# Patient Record
Sex: Female | Born: 1990
Health system: Southern US, Community
[De-identification: ages and names within clinical notes are randomized; demographics above are authoritative.]

## PROBLEM LIST (undated history)

## (undated) ENCOUNTER — Inpatient Hospital Stay (HOSPITAL_COMMUNITY): Payer: Self-pay

## (undated) DIAGNOSIS — R51 Headache: Secondary | ICD-10-CM

## (undated) DIAGNOSIS — N883 Incompetence of cervix uteri: Secondary | ICD-10-CM

## (undated) HISTORY — PX: CERCLAGE REMOVAL: SUR1451

## (undated) HISTORY — PX: DILATION AND CURETTAGE OF UTERUS: SHX78

---

## 2010-05-18 ENCOUNTER — Emergency Department (HOSPITAL_COMMUNITY)
Admission: EM | Admit: 2010-05-18 | Discharge: 2010-05-19 | Payer: Self-pay | Source: Home / Self Care | Admitting: Emergency Medicine

## 2010-08-26 LAB — URINE CULTURE: Culture  Setup Time: 201112051020

## 2010-08-26 LAB — URINALYSIS, ROUTINE W REFLEX MICROSCOPIC
Bilirubin Urine: NEGATIVE
Glucose, UA: NEGATIVE mg/dL
Ketones, ur: NEGATIVE mg/dL
Protein, ur: NEGATIVE mg/dL
pH: 6 (ref 5.0–8.0)

## 2010-08-26 LAB — URINE MICROSCOPIC-ADD ON

## 2011-06-02 ENCOUNTER — Encounter: Payer: Self-pay | Admitting: *Deleted

## 2011-06-02 ENCOUNTER — Emergency Department (HOSPITAL_COMMUNITY)
Admission: EM | Admit: 2011-06-02 | Discharge: 2011-06-02 | Discharge status: Against Medical Advice | Payer: 59 | Attending: Emergency Medicine | Admitting: Emergency Medicine

## 2011-06-02 DIAGNOSIS — Z0389 Encounter for observation for other suspected diseases and conditions ruled out: Secondary | ICD-10-CM | POA: Insufficient documentation

## 2011-06-02 NOTE — ED Notes (Signed)
Pt left AMA post triage, pre MD screening

## 2011-06-02 NOTE — ED Notes (Signed)
Patient reports she had a miscarriage in august.  She is pregnant again at approx 8 weeks.  She states 2 days ago she woke and noted some blood in her panties.  She states she has since seen dark colored discharge.  She also reports clots in the discharge.  Patient states she has gas bubbles denies abd pain

## 2011-06-02 NOTE — ED Notes (Signed)
Pt left AMA, let nurse first know.

## 2011-06-03 ENCOUNTER — Inpatient Hospital Stay (HOSPITAL_COMMUNITY): Payer: 59

## 2011-06-03 ENCOUNTER — Other Ambulatory Visit: Payer: Self-pay

## 2011-06-03 ENCOUNTER — Inpatient Hospital Stay (HOSPITAL_COMMUNITY)
Admission: AD | Admit: 2011-06-03 | Discharge: 2011-06-03 | Disposition: A | Discharge status: Home / Self Care | Payer: 59 | Source: Ambulatory Visit | Attending: Obstetrics and Gynecology | Admitting: Obstetrics and Gynecology

## 2011-06-03 ENCOUNTER — Encounter (HOSPITAL_COMMUNITY): Payer: Self-pay | Admitting: *Deleted

## 2011-06-03 DIAGNOSIS — O209 Hemorrhage in early pregnancy, unspecified: Secondary | ICD-10-CM | POA: Insufficient documentation

## 2011-06-03 DIAGNOSIS — O30049 Twin pregnancy, dichorionic/diamniotic, unspecified trimester: Secondary | ICD-10-CM

## 2011-06-03 DIAGNOSIS — O26859 Spotting complicating pregnancy, unspecified trimester: Secondary | ICD-10-CM

## 2011-06-03 DIAGNOSIS — O30009 Twin pregnancy, unspecified number of placenta and unspecified number of amniotic sacs, unspecified trimester: Secondary | ICD-10-CM | POA: Insufficient documentation

## 2011-06-03 DIAGNOSIS — O200 Threatened abortion: Secondary | ICD-10-CM

## 2011-06-03 LAB — CBC
Hemoglobin: 11.5 g/dL — ABNORMAL LOW (ref 12.0–15.0)
MCH: 30.8 pg (ref 26.0–34.0)
MCHC: 34.1 g/dL (ref 30.0–36.0)
MCV: 90.3 fL (ref 78.0–100.0)
RBC: 3.73 MIL/uL — ABNORMAL LOW (ref 3.87–5.11)

## 2011-06-03 LAB — URINE MICROSCOPIC-ADD ON

## 2011-06-03 LAB — URINALYSIS, ROUTINE W REFLEX MICROSCOPIC
Bilirubin Urine: NEGATIVE
Nitrite: NEGATIVE
Specific Gravity, Urine: >1.03 — ABNORMAL HIGH (ref 1.005–1.030)
Urobilinogen, UA: 0.2 mg/dL (ref 0.0–1.0)
pH: 5.5 (ref 5.0–8.0)

## 2011-06-03 LAB — WET PREP, GENITAL
Clue Cells Wet Prep HPF POC: NONE SEEN
Yeast Wet Prep HPF POC: NONE SEEN

## 2011-06-03 NOTE — Progress Notes (Signed)
T. Burleson, NP at bedside.  Assessment done and poc discussed with pt.  

## 2011-06-03 NOTE — ED Provider Notes (Signed)
History     Chief Complaint  Patient presents with  . Vaginal Discharge   HPI Gabriela Daugherty 20 y.o. LMP 04-15-11.  Positive home pregnancy test.  Student here in [place].  From [place] and will be traveling there for 2 weeks over the holidays.  Plans to see OB/GYN in [place] for one visit before returning to [facility].  Prior hx of 2 SABs.  Client very concerned about cervical length.  Apparently has been a problem in the past.  Hx of spontaneous loss of twin gestation at 45 weeks.  OB History    Grav Para Term Preterm Abortions TAB SAB Ect Mult Living   4 0 0 0 3 2 1 0 0 0       Past Medical History  Diagnosis Date  . No pertinent past medical history     Past Surgical History  Procedure Date  . Dilation and curettage of uterus     Family History  Problem Relation Age of Onset  . Hypertension Mother   . Depression Mother   . Hypertension Father   . Diabetes Father   . Stroke Father     History  Substance Use Topics  . Smoking status: Never Smoker   . Smokeless tobacco: Not on file  . Alcohol Use: No    Allergies: No Known Allergies  Prescriptions prior to admission  Medication Sig Dispense Refill  . Prenatal Vit-Fe Fumarate-FA (MULTIVITAMIN-PRENATAL) 27-0.8 MG TABS Take 1 tablet by mouth daily.          Review of Systems  Genitourinary:       Vaginal bleeding   Physical Exam   Blood pressure 131/89, pulse 96, temperature 98.2 F (36.8 C), temperature source Oral, resp. rate 20, height 5\' 6"  (1.676 m), weight 244 lb (110.678 kg), last menstrual period 04/15/2011, SpO2 98.00%.  Physical Exam  Nursing note and vitals reviewed. Constitutional: She is oriented to person, place, and time. She appears well-developed and well-nourished.  HENT:  Head: Normocephalic.  Eyes: EOM are normal.  Neck: Neck supple.  GI: Soft. There is no tenderness.  Genitourinary:       Speculum exam: Vagina - Small amount of dark blood with mucus noted, no odor Cervix - No contact  bleeding, no active bleeding seen Bimanual exam: Cervix external os open 1 cm, thick, firm Uterus non tender, difficult to size due to habitus Adnexa non tender, no masses bilaterally GC/Chlam, wet prep done Chaperone present for exam.  Musculoskeletal: Normal range of motion.  Neurological: She is alert and oriented to person, place, and time.  Skin: Skin is warm and dry.  Psychiatric: She has a normal mood and affect.   Results for orders placed during the hospital encounter of 06/03/11 (from the past 24 hour(s))  URINALYSIS, ROUTINE W REFLEX MICROSCOPIC     Status: Abnormal   Collection Time   06/03/11  6:47 PM      Component Value Range   Color, Urine YELLOW  YELLOW    APPearance CLEAR  CLEAR    Specific Gravity, Urine >1.030 (*) 1.005 - 1.030    pH 5.5  5.0 - 8.0    Glucose, UA NEGATIVE  NEGATIVE (mg/dL)   Hgb urine dipstick SMALL (*) NEGATIVE    Bilirubin Urine NEGATIVE  NEGATIVE    Ketones, ur NEGATIVE  NEGATIVE (mg/dL)   Protein, ur NEGATIVE  NEGATIVE (mg/dL)   Urobilinogen, UA 0.2  0.0 - 1.0 (mg/dL)   Nitrite NEGATIVE  NEGATIVE    Leukocytes,  UA NEGATIVE  NEGATIVE   URINE MICROSCOPIC-ADD ON     Status: Abnormal   Collection Time   06/03/11  6:47 PM      Component Value Range   Squamous Epithelial / LPF FEW (*) RARE    WBC, UA 3-6  <3 (WBC/hpf)   RBC / HPF 0-2  <3 (RBC/hpf)   Bacteria, UA FEW (*) RARE    Urine-Other MUCOUS PRESENT    WET PREP, GENITAL     Status: Abnormal   Collection Time   06/03/11  7:53 PM      Component Value Range   Yeast, Wet Prep NONE SEEN  NONE SEEN    Trich, Wet Prep NONE SEEN  NONE SEEN    Clue Cells, Wet Prep NONE SEEN  NONE SEEN    WBC, Wet Prep HPF POC FEW (*) NONE SEEN   HCG, QUANTITATIVE, PREGNANCY     Status: Abnormal   Collection Time   06/03/11  7:55 PM      Component Value Range   hCG, Beta Nyra Jabs, [place] [id number] (*) <5 (mIU/mL)  CBC     Status: Abnormal   Collection Time   06/03/11  7:55 PM      Component Value Range     WBC 11.6 (*) 4.0 - 10.5 (K/uL)   RBC 3.73 (*) 3.87 - 5.11 (MIL/uL)   Hemoglobin 11.5 (*) 12.0 - 15.0 (g/dL)   HCT 72.5 (*) 36.6 - 46.0 (%)   MCV 90.3  78.0 - 100.0 (fL)   MCH 30.8  26.0 - 34.0 (pg)   MCHC 34.1  30.0 - 36.0 (g/dL)   RDW 44.0  34.7 - 42.5 (%)   Platelets 292  150 - 400 (K/uL)  ABO/RH     Status: Normal   Collection Time   06/03/11  7:55 PM      Component Value Range   ABO/RH(D) O POS     US Ob Comp Less 14 Wks  06/03/2011  *RADIOLOGY REPORT*  Clinical Data: Pregnant, vaginal bleeding  TWIN OBSTETRIC <14WK [place]  Technique:  Transabdominal ultrasound examinations was performed for complete evaluation of the gestations as well as the maternal uterus, adnexal regions, and pelvic cul-de-sac.  Comparison:  None.  TWIN A Intrauterine gestational sac:  Visualized/normal in shape. Yolk sac: Present Embryo: Present Cardiac Activity: Present Heart Rate: 144 bpm  CRL: 8.3 mm, corresponding to an estimated gestational age of [redacted] weeks 6 days  TWIN B Intrauterine gestational sac:  Visualized/normal in shape. Yolk sac: Present Embryo: Present Cardiac Activity: Present Heart Rate: 146 bpm  CRL: 6.4 mm, corresponding to an estimated gestational age of [redacted] weeks 4 days [place] EDC: 01/23/2012  Maternal uterus/adnexae: The gestations are dichorionic/diamniotic.  No subchorionic hemorrhage.  Right ovary measures 4.6 x 2.8 x 3.6 cm and is notable for a 2.8 cm cyst / follicle.  Left ovary measures 2.1 x 4.0 x 3.5 cm and is notable for two mildly complex cysts/follicles measuring up to 1.8 cm.  2.2 cm left paraovarian cyst.  No free fluid.  IMPRESSION: Dichorionic/diamniotic twin live intrauterine gestations with estimated gestational age of [redacted] weeks 6 days by crown-rump length.  Original Report Authenticated By: Charline Bills, M.D.   US Ob Comp Add'l Gest Less 14 Wks  06/03/2011  *RADIOLOGY REPORT*  Clinical Data: Pregnant, vaginal bleeding  TWIN OBSTETRIC <14WK [place]  Technique:  Transabdominal ultrasound  examinations was performed for complete evaluation of the gestations as well as the maternal uterus, adnexal regions,  and pelvic cul-de-sac.  Comparison:  None.  TWIN A Intrauterine gestational sac:  Visualized/normal in shape. Yolk sac: Present Embryo: Present Cardiac Activity: Present Heart Rate: 144 bpm  CRL: 8.3 mm, corresponding to an estimated gestational age of [redacted] weeks 6 days  TWIN B Intrauterine gestational sac:  Visualized/normal in shape. Yolk sac: Present Embryo: Present Cardiac Activity: Present Heart Rate: 146 bpm  CRL: 6.4 mm, corresponding to an estimated gestational age of [redacted] weeks 4 days [place] EDC: 01/23/2012  Maternal uterus/adnexae: The gestations are dichorionic/diamniotic.  No subchorionic hemorrhage.  Right ovary measures 4.6 x 2.8 x 3.6 cm and is notable for a 2.8 cm cyst / follicle.  Left ovary measures 2.1 x 4.0 x 3.5 cm and is notable for two mildly complex cysts/follicles measuring up to 1.8 cm.  2.2 cm left paraovarian cyst.  No free fluid.  IMPRESSION: Dichorionic/diamniotic twin live intrauterine gestations with estimated gestational age of [redacted] weeks 6 days by crown-rump length.  Original Report Authenticated By: Charline Bills, M.D.   Assessment: Twin gestation 6 weeks 6 days   Bleeding in early pregnancy  Plan:  Start prenatal care ASAP  MAU Course  Procedures  MDM Care assumed by [facility], NP  Assessment and Plan    BURLESON,TERRI 06/03/2011, 7:52 PM   Nolene Bernheim, NP 06/03/11 [address] [place], NP 06/03/11 2120

## 2011-06-03 NOTE — Progress Notes (Signed)
Pt states for the last 3 days she has had a brownish colored discharge and pains in her abd tht come and go-had a + home UPT

## 2011-06-03 NOTE — Progress Notes (Signed)
SSE per NP.  VE done. Wet prep and cultures collected.

## 2011-06-04 LAB — GC/CHLAMYDIA PROBE AMP, GENITAL
Chlamydia, DNA Probe: NEGATIVE
GC Probe Amp, Genital: NEGATIVE

## 2011-06-04 LAB — POCT PREGNANCY, URINE: Preg Test, Ur: POSITIVE

## 2011-06-04 NOTE — ED Provider Notes (Signed)
Attestation of Attending Supervision of Advanced Practitioner: Evaluation and management procedures were performed by the PA/NP/CNM/OB Fellow under my supervision/collaboration. Chart reviewed and agree with management and plan.  Gabriela Daugherty V 06/04/2011 7:44 AM

## 2011-06-16 NOTE — L&D Delivery Note (Signed)
When I came on at about 7:15 AM today, twin A in vagina, vtx.  Prepped for delivery.  Delivered Twin A, Female, weight:      , and did have cardiac activity.  Cord clamped and vaginal exam found twin B with bulging membranes, Vtx and through  CX.  Two pushes by Mom resulted in SROM and delivery of Twin B, female, weight:       ,also with cardiac activity.  Placentas delivered shortly afterwards as well and afterwards uterus well contracted and bleeding minimal.  Will watch for 24 hours and probably D/C AM.

## 2011-07-03 ENCOUNTER — Encounter (HOSPITAL_COMMUNITY): Payer: Self-pay | Admitting: *Deleted

## 2011-07-03 ENCOUNTER — Inpatient Hospital Stay (HOSPITAL_COMMUNITY)
Admission: AD | Admit: 2011-07-03 | Discharge: 2011-07-03 | Disposition: A | Discharge status: Home / Self Care | Payer: Medicaid Other | Source: Ambulatory Visit | Attending: Obstetrics & Gynecology | Admitting: Obstetrics & Gynecology

## 2011-07-03 DIAGNOSIS — O26899 Other specified pregnancy related conditions, unspecified trimester: Secondary | ICD-10-CM

## 2011-07-03 DIAGNOSIS — O99891 Other specified diseases and conditions complicating pregnancy: Secondary | ICD-10-CM | POA: Insufficient documentation

## 2011-07-03 DIAGNOSIS — R51 Headache: Secondary | ICD-10-CM | POA: Insufficient documentation

## 2011-07-03 DIAGNOSIS — R519 Headache, unspecified: Secondary | ICD-10-CM

## 2011-07-03 LAB — CBC
MCHC: 34.3 g/dL (ref 30.0–36.0)
Platelets: 286 10*3/uL (ref 150–400)
RDW: 12.9 % (ref 11.5–15.5)
WBC: 10.2 10*3/uL (ref 4.0–10.5)

## 2011-07-03 LAB — URINALYSIS, ROUTINE W REFLEX MICROSCOPIC
Ketones, ur: NEGATIVE mg/dL
Leukocytes, UA: NEGATIVE
Nitrite: NEGATIVE
Protein, ur: NEGATIVE mg/dL
Urobilinogen, UA: 0.2 mg/dL (ref 0.0–1.0)

## 2011-07-03 LAB — COMPREHENSIVE METABOLIC PANEL
ALT: 6 U/L (ref 0–35)
AST: 7 U/L (ref 0–37)
Albumin: 3 g/dL — ABNORMAL LOW (ref 3.5–5.2)
Chloride: 103 mEq/L (ref 96–112)
Creatinine, Ser: 0.62 mg/dL (ref 0.50–1.10)
Potassium: 4.2 mEq/L (ref 3.5–5.1)
Sodium: 136 mEq/L (ref 135–145)
Total Bilirubin: 0.3 mg/dL (ref 0.3–1.2)

## 2011-07-03 LAB — DIFFERENTIAL
Basophils Absolute: 0 10*3/uL (ref 0.0–0.1)
Basophils Relative: 0 % (ref 0–1)
Monocytes Absolute: 0.5 10*3/uL (ref 0.1–1.0)
Neutro Abs: 6.9 10*3/uL (ref 1.7–7.7)
Neutrophils Relative %: 68 % (ref 43–77)

## 2011-07-03 MED ORDER — ACETAMINOPHEN 325 MG PO TABS
650.0000 mg | ORAL_TABLET | Freq: Once | ORAL | Status: AC
  Administered 2011-07-03: 650 mg via ORAL
  Filled 2011-07-03: qty 2

## 2011-07-03 NOTE — ED Provider Notes (Signed)
History     CSN: [id number]  Arrival date & time 07/03/11  0350   None     Chief Complaint  Patient presents with  . Headache    HPI Gabriela Daugherty is a 21 y.o. female @ 11.[redacted] weeks gestation with twins. She was evaluated here 06/03/11 and had labs, ultrasound and exam. She states that she has had headaches off and on since she found out she was pregnant. Tonight the headache is in the back of her head and and increases with lying flat. She denies nausea, vomiting or other problems. This is similar to head aches in the past as far as the pain but usually frontal headaches. Has not taken anything for pain. Though maybe dehydrated because she doesn't drink much water. Did not drink anything before coming in. Drank 1 cup of fruit punch and 3 cups of milk in the past 24 hours.   Past Medical History  Diagnosis Date  . No pertinent past medical history     Past Surgical History  Procedure Date  . Dilation and curettage of uterus     Family History  Problem Relation Age of Onset  . Hypertension Mother   . Depression Mother   . Hypertension Father   . Diabetes Father   . Stroke Father     History  Substance Use Topics  . Smoking status: Never Smoker   . Smokeless tobacco: Not on file  . Alcohol Use: No    OB History    Grav Para Term Preterm Abortions TAB SAB Ect Mult Living   4 0 0 0 3 2 1 0 0 0       Review of Systems  Constitutional: Negative for fever, chills, diaphoresis and fatigue.  HENT: Negative for ear pain, congestion, sore throat, facial swelling, neck pain, neck stiffness, dental problem and sinus pressure.   Eyes: Negative for photophobia, pain and discharge.  Respiratory: Negative for cough, chest tightness and wheezing.   Cardiovascular: Negative.   Gastrointestinal: Negative for nausea, vomiting, abdominal pain, diarrhea, constipation and abdominal distention.  Genitourinary: Positive for frequency. Negative for dysuria, flank pain, vaginal bleeding,  vaginal discharge, difficulty urinating and pelvic pain.  Musculoskeletal: Negative for myalgias, back pain and gait problem.  Skin: Negative for color change and rash.  Neurological: Positive for light-headedness and headaches. Negative for dizziness, speech difficulty, weakness and numbness.  Psychiatric/Behavioral: Negative for confusion and agitation. The patient is not nervous/anxious.     Allergies  Review of patient's allergies indicates no known allergies.  Home Medications  No current outpatient prescriptions on file.  BP 138/73  Pulse 83  Temp 98.9 F (37.2 C)  Resp 16  Ht 5\' 6"  (1.676 m)  Wt 238 lb (107.956 kg)  BMI 38.41 kg/m2  LMP 04/15/2011  Physical Exam  Nursing note and vitals reviewed. Constitutional: She is oriented to person, place, and time. She appears well-developed and well-nourished.       Patient alert, talking, all lights on in room with no photophobia.   HENT:  Head: Normocephalic.  Eyes: EOM are normal.  Neck: Normal range of motion. Neck supple.       No meningeal signs.  Cardiovascular: Normal rate.   Pulmonary/Chest: Effort normal.  Abdominal: Soft. There is no tenderness.       + FHT x2  Musculoskeletal: Normal range of motion.  Neurological: She is alert and oriented to person, place, and time. She has normal strength and normal reflexes. No cranial nerve deficit or  sensory deficit. She displays a negative Romberg sign.       Pupils equal and reactive, no nystagmus.  Skin: Skin is warm and dry.  Psychiatric: She has a normal mood and affect. Her behavior is normal. Judgment and thought content normal.   Results for orders placed during the hospital encounter of 07/03/11 (from the past 24 hour(s))  URINALYSIS, ROUTINE W REFLEX MICROSCOPIC     Status: Abnormal   Collection Time   07/03/11  4:06 AM      Component Value Range   Color, Urine YELLOW  YELLOW    APPearance HAZY (*) CLEAR    Specific Gravity, Urine 1.020  1.005 - 1.030    pH  8.0  5.0 - 8.0    Glucose, UA NEGATIVE  NEGATIVE (mg/dL)   Hgb urine dipstick NEGATIVE  NEGATIVE    Bilirubin Urine NEGATIVE  NEGATIVE    Ketones, ur NEGATIVE  NEGATIVE (mg/dL)   Protein, ur NEGATIVE  NEGATIVE (mg/dL)   Urobilinogen, UA 0.2  0.0 - 1.0 (mg/dL)   Nitrite NEGATIVE  NEGATIVE    Leukocytes, UA NEGATIVE  NEGATIVE   CBC     Status: Abnormal   Collection Time   07/03/11  4:25 AM      Component Value Range   WBC 10.2  4.0 - 10.5 (K/uL)   RBC 3.81 (*) 3.87 - 5.11 (MIL/uL)   Hemoglobin 11.9 (*) 12.0 - 15.0 (g/dL)   HCT 42.5 (*) 95.6 - 46.0 (%)   MCV 91.1  78.0 - 100.0 (fL)   MCH 31.2  26.0 - 34.0 (pg)   MCHC 34.3  30.0 - 36.0 (g/dL)   RDW 38.7  56.4 - 33.2 (%)   Platelets 286  150 - 400 (K/uL)  DIFFERENTIAL     Status: Normal   Collection Time   07/03/11  4:25 AM      Component Value Range   Neutrophils Relative 68  43 - 77 (%)   Neutro Abs 6.9  1.7 - 7.7 (K/uL)   Lymphocytes Relative 27  12 - 46 (%)   Lymphs Abs 2.8  0.7 - 4.0 (K/uL)   Monocytes Relative 5  3 - 12 (%)   Monocytes Absolute 0.5  0.1 - 1.0 (K/uL)   Eosinophils Relative 1  0 - 5 (%)   Eosinophils Absolute 0.1  0.0 - 0.7 (K/uL)   Basophils Relative 0  0 - 1 (%)   Basophils Absolute 0.0  0.0 - 0.1 (K/uL)  COMPREHENSIVE METABOLIC PANEL     Status: Abnormal   Collection Time   07/03/11  4:25 AM      Component Value Range   Sodium 136  135 - 145 (mEq/L)   Potassium 4.2  3.5 - 5.1 (mEq/L)   Chloride 103  96 - 112 (mEq/L)   CO2 25  19 - 32 (mEq/L)   Glucose, Bld 99  70 - 99 (mg/dL)   BUN 7  6 - 23 (mg/dL)   Creatinine, Ser 9.51  0.50 - 1.10 (mg/dL)   Calcium 9.4  8.4 - 88.4 (mg/dL)   Total Protein 6.6  6.0 - 8.3 (g/dL)   Albumin 3.0 (*) 3.5 - 5.2 (g/dL)   AST 7  0 - 37 (U/L)   ALT 6  0 - 35 (U/L)   Alkaline Phosphatase 64  39 - 117 (U/L)   Total Bilirubin 0.3  0.3 - 1.2 (mg/dL)   GFR calc non Af Amer >90  >90 (mL/min)   GFR  calc Af Amer >90  >90 (mL/min)   Assessment: Headache in first trimester  pregnancy   Plan:  PO hydration   Tylenol 650 mg po   Discussed in detail with patient need for po fluids to prevent headaches   Start prenatal care as scheduled   Return here for problems. ED Course: Headache relieved with tylenol and po fluids  Procedures  MDM          Kerrie Buffalo, NP 07/03/11 0522

## 2011-07-03 NOTE — Progress Notes (Signed)
Pt reports she can't sleep because of a headache. States she has had a headache off/on since she found out she was pregnant. States when she tries to lie down it hurts in the back of her head. Also having lower abd cramping off/on. Positive preg test here. Has certification appointment at [facility] 02/18

## 2011-07-07 NOTE — ED Provider Notes (Signed)
Agree with above note.  Gabriela Daugherty H. 07/07/2011 3:45 PM

## 2011-07-17 ENCOUNTER — Other Ambulatory Visit (HOSPITAL_COMMUNITY): Payer: Self-pay | Admitting: Obstetrics and Gynecology

## 2011-07-27 ENCOUNTER — Inpatient Hospital Stay (HOSPITAL_COMMUNITY)
Admission: AD | Admit: 2011-07-27 | Discharge: 2011-07-27 | Disposition: A | Discharge status: Home / Self Care | Payer: Medicaid Other | Source: Ambulatory Visit | Attending: Obstetrics and Gynecology | Admitting: Obstetrics and Gynecology

## 2011-07-27 ENCOUNTER — Encounter (HOSPITAL_COMMUNITY): Payer: Self-pay | Admitting: *Deleted

## 2011-07-27 ENCOUNTER — Inpatient Hospital Stay (HOSPITAL_COMMUNITY): Payer: Medicaid Other

## 2011-07-27 DIAGNOSIS — O2390 Unspecified genitourinary tract infection in pregnancy, unspecified trimester: Secondary | ICD-10-CM | POA: Insufficient documentation

## 2011-07-27 DIAGNOSIS — B49 Unspecified mycosis: Secondary | ICD-10-CM

## 2011-07-27 DIAGNOSIS — N898 Other specified noninflammatory disorders of vagina: Secondary | ICD-10-CM

## 2011-07-27 DIAGNOSIS — O26899 Other specified pregnancy related conditions, unspecified trimester: Secondary | ICD-10-CM

## 2011-07-27 DIAGNOSIS — B379 Candidiasis, unspecified: Secondary | ICD-10-CM

## 2011-07-27 DIAGNOSIS — N760 Acute vaginitis: Secondary | ICD-10-CM | POA: Insufficient documentation

## 2011-07-27 LAB — WET PREP, GENITAL: Yeast Wet Prep HPF POC: NONE SEEN

## 2011-07-27 MED ORDER — FLUCONAZOLE 150 MG PO TABS: 150.0000 mg | ORAL_TABLET | Freq: Once | ORAL | Status: AC

## 2011-07-27 NOTE — Progress Notes (Signed)
Noted mucus like d/c at 1100, cramping began today as well. Intermittent. Denies bleeding. Last intercourse yesterday. Painful afterwards, not during. Pt brought specimen in bag.

## 2011-07-27 NOTE — Progress Notes (Signed)
Thick mucous discharge.- like when she lost her last set of twins.  Does not have a cerclage.  (hx of 19wk twin loss, mucous plug came out and was 4 cm dilated.)

## 2011-07-27 NOTE — ED Provider Notes (Signed)
History     Chief Complaint  Patient presents with  . Vaginal Discharge   HPI 21 y.o. G4P0030 at [redacted]w[redacted]d with twin IUP. C/O mucousy discharge today, states she had similar discharge before she lost her last set of twins at 19 weeks. No bleeding or pain now. Discharge started after intercourse last night, had pain following intercourse.     Past Medical History  Diagnosis Date  . Prehypertension     Past Surgical History  Procedure Date  . Dilation and curettage of uterus     Family History  Problem Relation Age of Onset  . Hypertension Mother   . Depression Mother   . Hypertension Father   . Diabetes Father   . Stroke Father   . Anesthesia problems Neg Hx     History  Substance Use Topics  . Smoking status: Never Smoker   . Smokeless tobacco: Never Used  . Alcohol Use: No    Allergies: No Known Allergies  Prescriptions prior to admission  Medication Sig Dispense Refill  . acetaminophen (TYLENOL) 325 MG tablet Take 650 mg by mouth every 6 (six) hours as needed. For pain      . Prenatal Vit-Fe Fumarate-FA (PRENATAL MULTIVITAMIN) TABS Take 1 tablet by mouth at bedtime.         Review of Systems  Constitutional: Negative.   Respiratory: Negative.   Cardiovascular: Negative.   Gastrointestinal: Negative for nausea, vomiting, abdominal pain, diarrhea and constipation.  Genitourinary: Negative for dysuria, urgency, frequency, hematuria and flank pain.       Negative for vaginal bleeding, Positive for vaginal discharge and itching   Musculoskeletal: Negative.   Neurological: Negative.   Psychiatric/Behavioral: Negative.    Physical Exam   Blood pressure 112/66, pulse 99, temperature 97.4 F (36.3 C), temperature source Oral, resp. rate 20, height 5\' 6"  (1.676 m), weight 106.595 kg (235 lb), last menstrual period 04/15/2011.  Physical Exam  Nursing note and vitals reviewed. Constitutional: She is oriented to person, place, and time. She appears well-developed  and well-nourished. No distress.  HENT:  Head: Normocephalic and atraumatic.  Cardiovascular: Normal rate, regular rhythm and normal heart sounds.   Respiratory: Effort normal and breath sounds normal. No respiratory distress.  GI: Soft. Bowel sounds are normal. She exhibits no distension and no mass. There is no tenderness. There is no rebound and no guarding.  Genitourinary: There is rash (generalized skin irritation, c/w yeast) on the right labia. There is no lesion on the right labia. There is rash on the left labia. There is no lesion on the left labia. Uterus is not deviated, not enlarged, not fixed and not tender. Cervix exhibits no motion tenderness, no discharge and no friability. Right adnexum displays no mass, no tenderness and no fullness. Left adnexum displays no mass, no tenderness and no fullness. No erythema, tenderness or bleeding around the vagina. Vaginal discharge (clear mucous) found.  Neurological: She is alert and oriented to person, place, and time.  Skin: Skin is warm and dry.  Psychiatric: She has a normal mood and affect.    MAU Course  Procedures  Results for orders placed during the hospital encounter of 07/27/11 (from the past 24 hour(s))  WET PREP, GENITAL     Status: Abnormal   Collection Time   07/27/11  2:57 PM      Component Value Range   Yeast Wet Prep HPF POC NONE SEEN  NONE SEEN    Trich, Wet Prep NONE SEEN  NONE SEEN  Clue Cells Wet Prep HPF POC NONE SEEN  NONE SEEN    WBC, Wet Prep HPF POC FEW (*) NONE SEEN    U/S: Twin IUP, + FHT x 2, Cervical length 3.9 cm  Assessment and Plan  20 y.o. G4P0030 at [redacted]w[redacted]d H/O 19 week loss - cervical length WNL today Candidal vaginitis - rx diflucan F/U as scheduled, precautions rev'd, recommended pelvic rest   Teara Duerksen 07/27/2011, 4:46 PM

## 2011-07-28 LAB — GC/CHLAMYDIA PROBE AMP, GENITAL: Chlamydia, DNA Probe: NEGATIVE

## 2011-07-31 ENCOUNTER — Other Ambulatory Visit (HOSPITAL_COMMUNITY): Payer: Self-pay | Admitting: Obstetrics and Gynecology

## 2011-07-31 ENCOUNTER — Ambulatory Visit (HOSPITAL_COMMUNITY)
Admission: RE | Admit: 2011-07-31 | Discharge: 2011-07-31 | Disposition: A | Discharge status: Home / Self Care | Payer: Medicaid Other | Source: Ambulatory Visit | Attending: Obstetrics and Gynecology | Admitting: Obstetrics and Gynecology

## 2011-07-31 DIAGNOSIS — O30009 Twin pregnancy, unspecified number of placenta and unspecified number of amniotic sacs, unspecified trimester: Secondary | ICD-10-CM | POA: Insufficient documentation

## 2011-07-31 DIAGNOSIS — O09299 Supervision of pregnancy with other poor reproductive or obstetric history, unspecified trimester: Secondary | ICD-10-CM

## 2011-07-31 DIAGNOSIS — O30049 Twin pregnancy, dichorionic/diamniotic, unspecified trimester: Secondary | ICD-10-CM

## 2011-07-31 DIAGNOSIS — Z8751 Personal history of pre-term labor: Secondary | ICD-10-CM | POA: Insufficient documentation

## 2011-08-01 NOTE — Progress Notes (Signed)
Obstetric ultrasound performed today.  Reassuring findings noted.  Please see full report in [facility].  Please see notes from MFM consult for additional recommendations regarding pregnancy management.

## 2011-08-01 NOTE — Progress Notes (Signed)
MFM CONSULT  21 y/o G4P0030 at 94 2/7 weeks with dichorionic twin gestation presenting for recommendations regarding pregnancy management due to prior history of 2nd trimester delivery of twin gestation presumably due to cervical insufficiency.  Current pregnancy uncomplicated to this point.  No complaints today.    Patient has no significant medical history.  Her only current medications are PNV.    Prior obstetric history is significant for 2 pregnancy terminations in the late first trimester and prior delivery at [redacted] weeks gestation of twins in [place] in August 2012.  Patient reports some increased mucous discharge and presented for evaluation and was found to be 4 cm dilated on cervical exam.  She states she had no other associated symptoms and was told findings were too far advanced for rescue cerclage.  She reports undergoing D&E shortly after presentation with no post operative complications.    Given this history of a prior 2nd trimester delivery of twin gestation and prior cervical manipulation for 2 prior TAB procedures, the possibility of a recurrent 2nd trimester loss or extremely premature delivery for this current gestation may be significant.    Controversy exists regarding the effectiveness of both cerclage and progesterone therapy in the setting of multiple gestations.  These factors were discussed with Ms. Esch at length today and options for pregnancy described.  These include placement of prophylactic cerclage in the very near future, serial cervical length measurements, and weekly IM progesterone injections.  The risks and benefits of each of these were described and the uncertain outcome for this gestation with or without any of these interventions.  She desires to consider these further before making a definitive decision on how to proceed.    We did provide some recommendations for a prophylactic cerclage today, as her clinical circumstances indicate some potential for  benefit as compared with other twin gestations.  Alternatively, weekly cervical length measurements through the second and early third trimester could be undertaken with discussion of a rescue cerclage or vaginal progesterone, if significant changes were encountered.     Weekly IM progesterone is an option, but effectiveness is likely reduced as compared to singleton gestations.    Concerning signs and symptoms were reviewed.  Questions were answered.  Gabriela Daugherty desires to discuss these options further with you.  We did not schedule a follow up appointment, but would be pleased to participate in her care further as you desire.

## 2011-08-07 ENCOUNTER — Other Ambulatory Visit (HOSPITAL_COMMUNITY): Payer: Self-pay | Admitting: Obstetrics and Gynecology

## 2011-08-07 ENCOUNTER — Ambulatory Visit (HOSPITAL_COMMUNITY)
Admission: RE | Admit: 2011-08-07 | Discharge: 2011-08-07 | Disposition: A | Discharge status: Home / Self Care | Payer: Medicaid Other | Source: Ambulatory Visit | Attending: Obstetrics and Gynecology | Admitting: Obstetrics and Gynecology

## 2011-08-07 DIAGNOSIS — O2690 Pregnancy related conditions, unspecified, unspecified trimester: Secondary | ICD-10-CM

## 2011-08-07 DIAGNOSIS — Z3689 Encounter for other specified antenatal screening: Secondary | ICD-10-CM | POA: Insufficient documentation

## 2011-08-07 DIAGNOSIS — Z8751 Personal history of pre-term labor: Secondary | ICD-10-CM | POA: Insufficient documentation

## 2011-08-07 DIAGNOSIS — O09219 Supervision of pregnancy with history of pre-term labor, unspecified trimester: Secondary | ICD-10-CM

## 2011-08-07 NOTE — Progress Notes (Signed)
Ms. Wickware was seen for ultrasound appointment today.  Please see [facility] report for details.

## 2011-08-14 ENCOUNTER — Ambulatory Visit (HOSPITAL_COMMUNITY)
Admission: RE | Admit: 2011-08-14 | Discharge: 2011-08-14 | Disposition: A | Discharge status: Home / Self Care | Payer: Medicaid Other | Source: Ambulatory Visit | Attending: Obstetrics and Gynecology | Admitting: Obstetrics and Gynecology

## 2011-08-14 DIAGNOSIS — Z8751 Personal history of pre-term labor: Secondary | ICD-10-CM | POA: Insufficient documentation

## 2011-08-14 DIAGNOSIS — O30009 Twin pregnancy, unspecified number of placenta and unspecified number of amniotic sacs, unspecified trimester: Secondary | ICD-10-CM | POA: Insufficient documentation

## 2011-08-14 DIAGNOSIS — O30049 Twin pregnancy, dichorionic/diamniotic, unspecified trimester: Secondary | ICD-10-CM | POA: Insufficient documentation

## 2011-08-14 DIAGNOSIS — O09219 Supervision of pregnancy with history of pre-term labor, unspecified trimester: Secondary | ICD-10-CM

## 2011-08-17 ENCOUNTER — Other Ambulatory Visit (HOSPITAL_COMMUNITY): Payer: Self-pay | Admitting: Obstetrics and Gynecology

## 2011-08-17 DIAGNOSIS — O3430 Maternal care for cervical incompetence, unspecified trimester: Secondary | ICD-10-CM

## 2011-08-18 ENCOUNTER — Ambulatory Visit (HOSPITAL_COMMUNITY): Payer: Medicaid Other

## 2011-08-18 ENCOUNTER — Ambulatory Visit (HOSPITAL_COMMUNITY)
Admission: RE | Admit: 2011-08-18 | Discharge: 2011-08-18 | Disposition: A | Discharge status: Home / Self Care | Payer: Medicaid Other | Source: Ambulatory Visit | Attending: Obstetrics and Gynecology | Admitting: Obstetrics and Gynecology

## 2011-08-18 DIAGNOSIS — Z3689 Encounter for other specified antenatal screening: Secondary | ICD-10-CM | POA: Insufficient documentation

## 2011-08-18 DIAGNOSIS — O3430 Maternal care for cervical incompetence, unspecified trimester: Secondary | ICD-10-CM

## 2011-08-18 DIAGNOSIS — Z8751 Personal history of pre-term labor: Secondary | ICD-10-CM | POA: Insufficient documentation

## 2011-08-25 ENCOUNTER — Ambulatory Visit (HOSPITAL_COMMUNITY)
Admission: RE | Admit: 2011-08-25 | Discharge: 2011-08-25 | Disposition: A | Discharge status: Home / Self Care | Payer: Medicaid Other | Source: Ambulatory Visit | Attending: Obstetrics | Admitting: Obstetrics

## 2011-08-25 ENCOUNTER — Other Ambulatory Visit (HOSPITAL_COMMUNITY): Payer: Self-pay | Admitting: Maternal and Fetal Medicine

## 2011-08-25 DIAGNOSIS — O09299 Supervision of pregnancy with other poor reproductive or obstetric history, unspecified trimester: Secondary | ICD-10-CM | POA: Insufficient documentation

## 2011-08-25 DIAGNOSIS — O26879 Cervical shortening, unspecified trimester: Secondary | ICD-10-CM | POA: Insufficient documentation

## 2011-08-25 DIAGNOSIS — Z363 Encounter for antenatal screening for malformations: Secondary | ICD-10-CM | POA: Insufficient documentation

## 2011-08-25 DIAGNOSIS — Z1389 Encounter for screening for other disorder: Secondary | ICD-10-CM | POA: Insufficient documentation

## 2011-08-25 DIAGNOSIS — O3430 Maternal care for cervical incompetence, unspecified trimester: Secondary | ICD-10-CM

## 2011-08-25 DIAGNOSIS — O358XX0 Maternal care for other (suspected) fetal abnormality and damage, not applicable or unspecified: Secondary | ICD-10-CM | POA: Insufficient documentation

## 2011-08-25 DIAGNOSIS — O30049 Twin pregnancy, dichorionic/diamniotic, unspecified trimester: Secondary | ICD-10-CM

## 2011-08-25 DIAGNOSIS — Z8751 Personal history of pre-term labor: Secondary | ICD-10-CM | POA: Insufficient documentation

## 2011-09-03 ENCOUNTER — Ambulatory Visit (HOSPITAL_COMMUNITY): Payer: Medicaid Other

## 2011-09-04 ENCOUNTER — Inpatient Hospital Stay (HOSPITAL_COMMUNITY): Payer: Medicaid Other | Admitting: Anesthesiology

## 2011-09-04 ENCOUNTER — Encounter (HOSPITAL_COMMUNITY): Payer: Self-pay | Admitting: *Deleted

## 2011-09-04 ENCOUNTER — Encounter (HOSPITAL_COMMUNITY): Payer: Self-pay | Admitting: Anesthesiology

## 2011-09-04 ENCOUNTER — Inpatient Hospital Stay (HOSPITAL_COMMUNITY)
Admission: AD | Admit: 2011-09-04 | Discharge: 2011-09-05 | DRG: 775 | Disposition: A | Discharge status: Home / Self Care | Payer: Medicaid Other | Source: Ambulatory Visit | Attending: Obstetrics and Gynecology | Admitting: Obstetrics and Gynecology

## 2011-09-04 DIAGNOSIS — O30009 Twin pregnancy, unspecified number of placenta and unspecified number of amniotic sacs, unspecified trimester: Secondary | ICD-10-CM | POA: Diagnosis present

## 2011-09-04 DIAGNOSIS — Z01812 Encounter for preprocedural laboratory examination: Secondary | ICD-10-CM

## 2011-09-04 DIAGNOSIS — O42919 Preterm premature rupture of membranes, unspecified as to length of time between rupture and onset of labor, unspecified trimester: Secondary | ICD-10-CM

## 2011-09-04 DIAGNOSIS — O411090 Infection of amniotic sac and membranes, unspecified, unspecified trimester, not applicable or unspecified: Secondary | ICD-10-CM | POA: Diagnosis present

## 2011-09-04 DIAGNOSIS — O4290 Premature rupture of membranes, unspecified as to length of time between rupture and onset of labor, unspecified weeks of gestation: Secondary | ICD-10-CM | POA: Diagnosis present

## 2011-09-04 DIAGNOSIS — O3430 Maternal care for cervical incompetence, unspecified trimester: Principal | ICD-10-CM | POA: Diagnosis present

## 2011-09-04 LAB — RPR: RPR Ser Ql: NONREACTIVE

## 2011-09-04 LAB — CBC
HCT: 32.5 % — ABNORMAL LOW (ref 36.0–46.0)
MCV: 92.9 fL (ref 78.0–100.0)
RDW: 13.5 % (ref 11.5–15.5)
WBC: 22.5 10*3/uL — ABNORMAL HIGH (ref 4.0–10.5)

## 2011-09-04 MED ORDER — ONDANSETRON HCL 4 MG/2ML IJ SOLN: 4.0000 mg | Freq: Four times a day (QID) | INTRAMUSCULAR | Status: DC | PRN

## 2011-09-04 MED ORDER — FENTANYL 2.5 MCG/ML BUPIVACAINE 1/10 % EPIDURAL INFUSION (WH - ANES)
14.0000 mL/h | INTRAMUSCULAR | Status: DC
  Administered 2011-09-04 (×2): 14 mL/h via EPIDURAL
  Filled 2011-09-04 (×2): qty 60

## 2011-09-04 MED ORDER — PHENYLEPHRINE 40 MCG/ML (10ML) SYRINGE FOR IV PUSH (FOR BLOOD PRESSURE SUPPORT)
80.0000 ug | PREFILLED_SYRINGE | INTRAVENOUS | Status: DC | PRN
  Filled 2011-09-04: qty 5

## 2011-09-04 MED ORDER — ZOLPIDEM TARTRATE 5 MG PO TABS: 5.0000 mg | ORAL_TABLET | Freq: Every evening | ORAL | Status: DC | PRN

## 2011-09-04 MED ORDER — TETANUS-DIPHTH-ACELL PERTUSSIS 5-2.5-18.5 LF-MCG/0.5 IM SUSP
0.5000 mL | Freq: Once | INTRAMUSCULAR | Status: DC
  Filled 2011-09-04: qty 0.5

## 2011-09-04 MED ORDER — IBUPROFEN 600 MG PO TABS
600.0000 mg | ORAL_TABLET | Freq: Four times a day (QID) | ORAL | Status: DC | PRN
  Administered 2011-09-04: 600 mg via ORAL
  Filled 2011-09-04: qty 1

## 2011-09-04 MED ORDER — DIPHENHYDRAMINE HCL 25 MG PO CAPS: 25.0000 mg | ORAL_CAPSULE | Freq: Four times a day (QID) | ORAL | Status: DC | PRN

## 2011-09-04 MED ORDER — ONDANSETRON HCL 4 MG/2ML IJ SOLN: 4.0000 mg | INTRAMUSCULAR | Status: DC | PRN

## 2011-09-04 MED ORDER — BENZOCAINE-MENTHOL 20-0.5 % EX AERO: 1.0000 "application " | INHALATION_SPRAY | CUTANEOUS | Status: DC | PRN

## 2011-09-04 MED ORDER — OXYTOCIN 20 UNITS IN LACTATED RINGERS INFUSION - SIMPLE
125.0000 mL/h | Freq: Once | INTRAVENOUS | Status: AC
  Administered 2011-09-04: 125 mL/h via INTRAVENOUS

## 2011-09-04 MED ORDER — OXYTOCIN BOLUS FROM INFUSION
500.0000 mL | Freq: Once | INTRAVENOUS | Status: DC
  Filled 2011-09-04: qty 1000
  Filled 2011-09-04: qty 500

## 2011-09-04 MED ORDER — SENNOSIDES-DOCUSATE SODIUM 8.6-50 MG PO TABS
2.0000 | ORAL_TABLET | Freq: Every day | ORAL | Status: DC
  Administered 2011-09-04: 2 via ORAL

## 2011-09-04 MED ORDER — LACTATED RINGERS IV SOLN
500.0000 mL | INTRAVENOUS | Status: DC | PRN
  Administered 2011-09-04: 1000 mL via INTRAVENOUS

## 2011-09-04 MED ORDER — CITRIC ACID-SODIUM CITRATE 334-500 MG/5ML PO SOLN: 30.0000 mL | ORAL | Status: DC | PRN

## 2011-09-04 MED ORDER — WITCH HAZEL-GLYCERIN EX PADS: 1.0000 "application " | MEDICATED_PAD | CUTANEOUS | Status: DC | PRN

## 2011-09-04 MED ORDER — SODIUM CHLORIDE 0.9 % IJ SOLN
3.0000 mL | Freq: Two times a day (BID) | INTRAMUSCULAR | Status: DC
  Administered 2011-09-04: 3 mL via INTRAVENOUS

## 2011-09-04 MED ORDER — LACTATED RINGERS IV BOLUS (SEPSIS)
1000.0000 mL | Freq: Once | INTRAVENOUS | Status: AC
  Administered 2011-09-04: 1000 mL via INTRAVENOUS

## 2011-09-04 MED ORDER — EPHEDRINE 5 MG/ML INJ
10.0000 mg | INTRAVENOUS | Status: DC | PRN
  Filled 2011-09-04: qty 4

## 2011-09-04 MED ORDER — ONDANSETRON HCL 4 MG PO TABS: 4.0000 mg | ORAL_TABLET | ORAL | Status: DC | PRN

## 2011-09-04 MED ORDER — LANOLIN HYDROUS EX OINT: TOPICAL_OINTMENT | CUTANEOUS | Status: DC | PRN

## 2011-09-04 MED ORDER — OXYCODONE-ACETAMINOPHEN 5-325 MG PO TABS
1.0000 | ORAL_TABLET | ORAL | Status: DC | PRN
  Administered 2011-09-04: 2 via ORAL
  Filled 2011-09-04: qty 2

## 2011-09-04 MED ORDER — PHENYLEPHRINE 40 MCG/ML (10ML) SYRINGE FOR IV PUSH (FOR BLOOD PRESSURE SUPPORT): 80.0000 ug | PREFILLED_SYRINGE | INTRAVENOUS | Status: DC | PRN

## 2011-09-04 MED ORDER — DIPHENHYDRAMINE HCL 50 MG/ML IJ SOLN: 12.5000 mg | INTRAMUSCULAR | Status: DC | PRN

## 2011-09-04 MED ORDER — FLEET ENEMA 7-19 GM/118ML RE ENEM: 1.0000 | ENEMA | RECTAL | Status: DC | PRN

## 2011-09-04 MED ORDER — SODIUM CHLORIDE 0.9 % IV SOLN: 250.0000 mL | INTRAVENOUS | Status: DC | PRN

## 2011-09-04 MED ORDER — TERBUTALINE SULFATE 1 MG/ML IJ SOLN
0.2500 mg | Freq: Once | INTRAMUSCULAR | Status: DC
  Filled 2011-09-04: qty 1

## 2011-09-04 MED ORDER — SIMETHICONE 80 MG PO CHEW: 80.0000 mg | CHEWABLE_TABLET | ORAL | Status: DC | PRN

## 2011-09-04 MED ORDER — OXYCODONE-ACETAMINOPHEN 5-325 MG PO TABS
1.0000 | ORAL_TABLET | ORAL | Status: DC | PRN
  Administered 2011-09-04: 1 via ORAL
  Filled 2011-09-04: qty 1

## 2011-09-04 MED ORDER — IBUPROFEN 600 MG PO TABS
600.0000 mg | ORAL_TABLET | Freq: Four times a day (QID) | ORAL | Status: DC
  Administered 2011-09-04 – 2011-09-05 (×3): 600 mg via ORAL
  Filled 2011-09-04 (×3): qty 1

## 2011-09-04 MED ORDER — CEFAZOLIN SODIUM 1-5 GM-% IV SOLN
1.0000 g | Freq: Four times a day (QID) | INTRAVENOUS | Status: DC
  Administered 2011-09-04: 1 g via INTRAVENOUS
  Filled 2011-09-04 (×3): qty 50

## 2011-09-04 MED ORDER — ACETAMINOPHEN 325 MG PO TABS: 650.0000 mg | ORAL_TABLET | ORAL | Status: DC | PRN

## 2011-09-04 MED ORDER — LACTATED RINGERS IV SOLN
500.0000 mL | Freq: Once | INTRAVENOUS | Status: AC
  Administered 2011-09-04: 500 mL via INTRAVENOUS

## 2011-09-04 MED ORDER — LIDOCAINE HCL (PF) 1 % IJ SOLN: 30.0000 mL | INTRAMUSCULAR | Status: DC | PRN

## 2011-09-04 MED ORDER — LIDOCAINE HCL (PF) 1 % IJ SOLN
INTRAMUSCULAR | Status: DC | PRN
  Administered 2011-09-04 (×3): 4 mL

## 2011-09-04 MED ORDER — SODIUM CHLORIDE 0.9 % IJ SOLN: 3.0000 mL | INTRAMUSCULAR | Status: DC | PRN

## 2011-09-04 MED ORDER — EPHEDRINE 5 MG/ML INJ: 10.0000 mg | INTRAVENOUS | Status: DC | PRN

## 2011-09-04 MED ORDER — HYDROCORTISONE 1 % EX CREA
TOPICAL_CREAM | Freq: Three times a day (TID) | CUTANEOUS | Status: DC | PRN
  Administered 2011-09-04: 1 via TOPICAL
  Filled 2011-09-04: qty 28

## 2011-09-04 MED ORDER — DIBUCAINE 1 % RE OINT: 1.0000 "application " | TOPICAL_OINTMENT | RECTAL | Status: DC | PRN

## 2011-09-04 NOTE — Anesthesia Procedure Notes (Signed)
Epidural Patient location during procedure: OB Start time: 09/04/2011 3:05 AM Reason for block: procedure for pain  Staffing Performed by: anesthesiologist   Preanesthetic Checklist Completed: patient identified, site marked, surgical consent, pre-op evaluation, timeout performed, IV checked, risks and benefits discussed and monitors and equipment checked  Epidural Patient position: sitting Prep: site prepped and draped and DuraPrep Patient monitoring: continuous pulse ox and blood pressure Approach: midline Injection technique: LOR air  Needle:  Needle type: Tuohy  Needle gauge: 17 G Needle length: 9 cm Needle insertion depth: 6 cm Catheter type: closed end flexible Catheter size: 19 Gauge Catheter at skin depth: 11 cm Test dose: negative  Assessment Events: blood not aspirated, injection not painful, no injection resistance, negative IV test and no paresthesia  Additional Notes Discussed risk of headache, infection, bleeding, nerve injury and failed or incomplete block.  Patient voices understanding and wishes to proceed.

## 2011-09-04 NOTE — Progress Notes (Signed)
Mom stable.  Bleeding minimal and comfortable.  Discussed breast binder to suppress breast milk.  Twin A weighed 11.8 oz. And Twin B weighed 13 oz., both female.

## 2011-09-04 NOTE — MAU Provider Note (Signed)
History   Gabriela Daugherty is a 21 y.o. year old G46P0030 female at [redacted]w[redacted]d weeks gestation who presents to MAU in severe distress reporting strong contractions. She was Dx w/ a short cervix and MFM attempted cerclage placement 1 week ago, but Baby A's membranes ruptured during the procedure. She was seen at [facility] yesterday afternoon for contractions and was discharged. Pt was not able to communicate much of her Hx or plan for management when she arrived due to pain.   First Provider Initiated Contact with Patient 09/04/11 0121.  CSN: [id number]  Arrival date and time: 09/04/11 0113   None     Chief Complaint  Patient presents with  . Abdominal Pain   HPI  OB History    Grav Para Term Preterm Abortions TAB SAB Ect Mult Living   4 0 0 0 3 2 1 0 1 0       Past Medical History  Diagnosis Date  . Prehypertension     Past Surgical History  Procedure Date  . Dilation and curettage of uterus     Family History  Problem Relation Age of Onset  . Hypertension Mother   . Depression Mother   . Hypertension Father   . Diabetes Father   . Stroke Father   . Anesthesia problems Neg Hx     History  Substance Use Topics  . Smoking status: Never Smoker   . Smokeless tobacco: Never Used  . Alcohol Use: No    Allergies: No Known Allergies  Prescriptions prior to admission  Medication Sig Dispense Refill  . acetaminophen (TYLENOL) 325 MG tablet Take 650 mg by mouth every 6 (six) hours as needed. For pain      . AMOXICILLIN PO Take 1 tablet by mouth daily.      . Prenatal Vit-Fe Fumarate-FA (PRENATAL MULTIVITAMIN) TABS Take 1 tablet by mouth at bedtime.       Marland Kitchen PRESCRIPTION MEDICATION Take 1 tablet by mouth daily. Medication begins with "E"        Review of Systems  Unable to perform ROS: severity of pain  Constitutional: Negative for fever.  Gastrointestinal: Positive for abdominal pain.   Physical Exam   Blood pressure 138/66, pulse 128, temperature 98.9 F (37.2 C),  temperature source Oral, resp. rate 18, last menstrual period 04/15/2011, SpO2 98.00%.  Physical Exam  Constitutional: She is oriented to person, place, and time. She appears well-developed and well-nourished. She appears distressed (severely).  Respiratory: Effort normal.  GI: Soft. There is tenderness (mild low abd ). There is no guarding.  Genitourinary: Uterus is tender (mildly). Cervix exhibits no friability. No bleeding around the vagina. Vaginal discharge (moderate amount of yellow-tinged mucus. Small amount of clear fluid, normal odor) found.  Neurological: She is alert and oriented to person, place, and time.  Skin: Skin is warm and dry.  Psychiatric: Her mood appears anxious.  0121: Dilation: 3  Effacement (%): 70  Station: -3  ? vertex   Exam by:: Ivonne Andrew, CNM  FHR:  A 160's  B 140's UC's Q2-3   MAU Course  Procedures  0130: Dr. Dareen Piano called. Stated pt was to be managed by MFM. Recommended transfer to [place]. Expressed concern to Dr. Dareen Piano that pt may deliver in transit. Will call MFM. IV bolus and terbutaline given 0140: Dr. Otho Perl consulted. Does not recommend transfer to [facility]. Recommends expectant management. If baby A delivers, may consider cerclage, Indocin and broad-spectrum ABX afterward if labor stops. Will ask Dareen Piano if he will  manage pt. 4098: Dr. Dareen Piano on Unit. Will manage pt. Informed him of MFM recommendations. Pt UC's much better. Cervix unchanged.  Assessment and Plan  Preterm labor 1 week S/P PPROM  Admit  Gabriela Daugherty 09/04/2011, 2:40 AM

## 2011-09-04 NOTE — H&P (Signed)
Pt is a 21 year old black female, G4P0030, at [redacted] weeks EGA who presents to the ER c/o labor. Pt had an attempt at cerclage placement last week at [facility] when she was found to have a shortened cervix. The patient has a twin pregnancy. During the cerclage the membranes were ruptured. The plan was to follow the patient expectantly. The pt is aware that the fetuses are not viable.  She was told that if there is no evidence of infection and the second fetus stays in the uterus that a cerclage may be attempted. The patient is followed by the MFM service. Pt was seen by them today in [place].  She states that she has been cramping since 6:30 this am. This am her cervix was closed. Pt states that the pain this am was too severe for her to travel to [facility] so she came to [facility]. She states that both fetuses had FHTs this am. Pt denies any F/C.  PMHx: See Catha Gosselin  PE:  Large black female c/o pain with contractions         Abd- Gravid, non tender, palp contractions.         Cx- 90/2/-2 vtx IMP/ Nonviable Twin IUP with ROM in Twin A          Labor Plan/ Admit          Epidural          Expect SVD

## 2011-09-04 NOTE — Progress Notes (Signed)
UR Chart review completed.  

## 2011-09-04 NOTE — MAU Note (Signed)
Ivonne Andrew CNM in tosee pt, SSE performed.

## 2011-09-04 NOTE — Progress Notes (Signed)
Pt has a WBC of 22. Like has chorio. Will start Ancef.

## 2011-09-04 NOTE — Anesthesia Preprocedure Evaluation (Signed)
Anesthesia Evaluation  Patient identified by MRN, date of birth, ID band Patient awake    Reviewed: Allergy & Precautions, H&P , NPO status , Patient's Chart, lab work & pertinent test results, reviewed documented beta blocker date and time   History of Anesthesia Complications Negative for: history of anesthetic complications  Airway Mallampati: I TM Distance: >3 FB Neck ROM: full    Dental  (+) Teeth Intact   Pulmonary neg pulmonary ROS,  breath sounds clear to auscultation        Cardiovascular negative cardio ROS  Rhythm:regular Rate:Normal     Neuro/Psych negative neurological ROS  negative psych ROS   GI/Hepatic negative GI ROS, Neg liver ROS,   Endo/Other  Morbid obesity  Renal/GU negative Renal ROS     Musculoskeletal   Abdominal   Peds  Hematology negative hematology ROS (+)   Anesthesia Other Findings   Reproductive/Obstetrics (+) Pregnancy (20 weeks, twins, ruptured and laboring)                           Anesthesia Physical Anesthesia Plan  ASA: III  Anesthesia Plan: Epidural   Post-op Pain Management:    Induction:   Airway Management Planned:   Additional Equipment:   Intra-op Plan:   Post-operative Plan:   Informed Consent: I have reviewed the patients History and Physical, chart, labs and discussed the procedure including the risks, benefits and alternatives for the proposed anesthesia with the patient or authorized representative who has indicated his/her understanding and acceptance.     Plan Discussed with:   Anesthesia Plan Comments:         Anesthesia Quick Evaluation

## 2011-09-04 NOTE — MAU Note (Signed)
Pt G4 P0 at 20.2wks, having severe abd pain.  Twin pregnancy, attempted cerclage last week, unsuccessful.

## 2011-09-04 NOTE — MAU Note (Signed)
Dr. Anderson in to see pt.

## 2011-09-04 NOTE — Progress Notes (Signed)
I was paged by Labor and Delivery nurse soon after Gabriela Daugherty delivered her twins.  She was holding Baby, Barton, when I walked in to the room and he still had a heartbeat at that time.  I supported her through the end of his life and was present with her while she held her baby.  I saw her later on Women's unit and provided some grief support and grief education to her and the babys' father.   They have family support and I also gave them resources to reach out to in the community for support, including [employer].  Kathleen Argue [phone] 4:12 PM   09/04/11 1600  Clinical Encounter Type  Visited With Patient and family together  Visit Type Death;Spiritual support  Referral From Nurse  Spiritual Encounters  Spiritual Needs Grief support

## 2011-09-05 LAB — CBC
HCT: 29.4 % — ABNORMAL LOW (ref 36.0–46.0)
Hemoglobin: 10 g/dL — ABNORMAL LOW (ref 12.0–15.0)
MCV: 94.2 fL (ref 78.0–100.0)
RDW: 13.7 % (ref 11.5–15.5)
WBC: 16.4 10*3/uL — ABNORMAL HIGH (ref 4.0–10.5)

## 2011-09-05 NOTE — Discharge Instructions (Addendum)
Grief Reaction  Grief is a normal response to the death of someone close to you. Feelings of fear, anger, and guilt can affect almost everyone who loses someone they love. Symptoms of depression are also common. These include problems with sleep, loss of appetite, and lack of energy. These grief reaction symptoms often last for weeks to months after a loss. They may also return during special times that remind you of the person you lost, such as an anniversary or birthday.  Anxiety, insomnia, irritability, and deep depression may last beyond the period of normal grief. If you experience these feelings for 6 months or longer, you may have clinical depression. Clinical depression requires further medical attention. If you think that you have clinical depression, you should contact your caregiver. If you have a history of depression and or a family history of depression, you are at greater risk of clinical depression. You are also at greater risk of developing clinical depression if the loss was traumatic or the loss was of someone with whom you had unresolved issues.    A grief reaction can become complicated by being blocked. This means being unable to cry or express extreme emotions. This may prolong the grieving period and worsen the emotional effects of the loss. Mourning is a natural event in human life. A healthy grief reaction is one that is not blocked . It requires a time of sadness and readjustment.It is very important to share your sorrow and fear with others, especially close friends and family. Professional counselors and clergy can also help you process your grief.  Document Released: 06/01/2005 Document Revised: 05/21/2011 Document Reviewed: 02/09/2006  [employer] Patient Information 2012 [employer].

## 2011-09-05 NOTE — Anesthesia Postprocedure Evaluation (Signed)
  Anesthesia Post-op Note  Patient: Gabriela Daugherty  Procedure(s) Performed: * No procedures listed *  Patient Location: Mother/Baby  Anesthesia Type: Epidural  Level of Consciousness: awake, alert  and oriented  Airway and Oxygen Therapy: Patient Spontanous Breathing  Post-op Pain: mild  Post-op Assessment: Patient's Cardiovascular Status Stable, Respiratory Function Stable, Patent Airway, No signs of Nausea or vomiting and Pain level controlled  Post-op Vital Signs: stable  Complications: No apparent anesthesia complications

## 2011-09-05 NOTE — Progress Notes (Signed)
D/C instructions reviewed with pt.  Pt. Discussed loss of babies and why it happened.  Allowed her to ventilate and encouraged her to discuss further with her M.D.  Pt. Stated understanding of instructions.  Ambulated to car with staff without incident.  D/C'd home with friends and family.

## 2011-09-05 NOTE — Discharge Summary (Signed)
Discharge diagnoses-  #1-20 week intrauterine pregnancy, twins, incompetent cervix, premature rupture of membranes-and subsequent labor and delivery-twin A 11.8 ounces, vertex and twin B 13.0 ounces, vertex-both expired secondary to extreme immaturity  #2-blood type O-positive  Procedures-delivery of twin A and twin B, both vertex  Summary-Gabriela Daugherty was followed in our office during this pregnancy with known twins and in early March she was noted on ultrasound by maternal fetal medicine to have a funneling cervix and in mid-March arrangements were made for a cerclage to be placed at [facility]. The cervix had apparently thinned significantly on the left-perhaps consistent with a previous tear and membranes were ruptured during the attempted cerclage placement.  [facility] early [facility] of 3/22 in labor and delivered both twins vaginally around 8 AM on 3/22.  Her post delivery course was unremarkable.she remained afebrile and very comfortable with minimal bleeding and on the morning of 3/23 was ready for discharge. A CBC on 3/23 found a hemoglobin of 10.0, white count of 16,400 and a platelet count of 241,000.  She was given a prescription for Motrin 600 mg to use as needed for cramping or mild pain. Instructions were given to the patient werel well understood. She will return to the office in approximately 10-14 days for followup or be seen as needed.  As I discussed with the patient at the time of discharge, it is my opinion in early cerclage be placed with her next pregnancy as I believe this patient has an incompetent cervix.  Condition on discharge-improved

## 2011-09-14 DEATH — deceased

## 2011-09-18 ENCOUNTER — Encounter (HOSPITAL_COMMUNITY): Payer: Self-pay | Admitting: *Deleted

## 2011-09-18 ENCOUNTER — Other Ambulatory Visit: Payer: Self-pay | Admitting: Obstetrics and Gynecology

## 2011-09-18 ENCOUNTER — Encounter (HOSPITAL_COMMUNITY)
Admission: AD | Disposition: A | Discharge status: Home / Self Care | Payer: Self-pay | Source: Ambulatory Visit | Attending: Obstetrics and Gynecology

## 2011-09-18 ENCOUNTER — Inpatient Hospital Stay (HOSPITAL_COMMUNITY): Payer: Medicaid Other

## 2011-09-18 ENCOUNTER — Encounter (HOSPITAL_COMMUNITY): Payer: Self-pay

## 2011-09-18 ENCOUNTER — Ambulatory Visit (HOSPITAL_COMMUNITY)
Admission: AD | Admit: 2011-09-18 | Discharge: 2011-09-18 | Disposition: A | Discharge status: Home / Self Care | Payer: Medicaid Other | Source: Ambulatory Visit | Attending: Obstetrics and Gynecology | Admitting: Obstetrics and Gynecology

## 2011-09-18 DIAGNOSIS — IMO0002 Reserved for concepts with insufficient information to code with codable children: Secondary | ICD-10-CM

## 2011-09-18 HISTORY — PX: DILATION AND EVACUATION: SHX1459

## 2011-09-18 LAB — CBC
Hemoglobin: 11.9 g/dL — ABNORMAL LOW (ref 12.0–15.0)
MCH: 30.9 pg (ref 26.0–34.0)
MCHC: 32.3 g/dL (ref 30.0–36.0)
MCV: 95.6 fL (ref 78.0–100.0)
RBC: 3.85 MIL/uL — ABNORMAL LOW (ref 3.87–5.11)

## 2011-09-18 SURGERY — DILATION AND EVACUATION, UTERUS
Anesthesia: Monitor Anesthesia Care | Site: Vagina | Wound class: Clean Contaminated

## 2011-09-18 MED ORDER — OXYCODONE-ACETAMINOPHEN 5-325 MG PO TABS: 2.0000 | ORAL_TABLET | ORAL | Status: AC | PRN

## 2011-09-18 MED ORDER — BUTORPHANOL TARTRATE 2 MG/ML IJ SOLN: 1.0000 mg | Freq: Once | INTRAMUSCULAR | Status: DC | PRN

## 2011-09-18 MED ORDER — MISOPROSTOL 200 MCG PO TABS
ORAL_TABLET | ORAL | Status: AC
  Filled 2011-09-18: qty 4

## 2011-09-18 MED ORDER — FENTANYL CITRATE 0.05 MG/ML IJ SOLN
25.0000 ug | INTRAMUSCULAR | Status: DC | PRN
  Administered 2011-09-18: 50 ug via INTRAVENOUS

## 2011-09-18 MED ORDER — IBUPROFEN 600 MG PO TABS: 600.0000 mg | ORAL_TABLET | Freq: Four times a day (QID) | ORAL | Status: AC | PRN

## 2011-09-18 MED ORDER — FAMOTIDINE IN NACL 20-0.9 MG/50ML-% IV SOLN
20.0000 mg | Freq: Once | INTRAVENOUS | Status: AC
  Administered 2011-09-18: 20 mg via INTRAVENOUS
  Filled 2011-09-18: qty 50

## 2011-09-18 MED ORDER — ONDANSETRON HCL 4 MG/2ML IJ SOLN: 4.0000 mg | Freq: Once | INTRAMUSCULAR | Status: DC | PRN

## 2011-09-18 MED ORDER — VASOPRESSIN 20 UNIT/ML IJ SOLN
INTRAMUSCULAR | Status: AC
  Filled 2011-09-18: qty 1

## 2011-09-18 MED ORDER — MEPERIDINE HCL 25 MG/ML IJ SOLN: 6.2500 mg | INTRAMUSCULAR | Status: DC | PRN

## 2011-09-18 MED ORDER — LIDOCAINE HCL 1 % IJ SOLN
INTRAMUSCULAR | Status: DC | PRN
  Administered 2011-09-18: 10 mL

## 2011-09-18 MED ORDER — CEFAZOLIN SODIUM-DEXTROSE 2-3 GM-% IV SOLR
2.0000 g | INTRAVENOUS | Status: AC
  Administered 2011-09-18: 2 g via INTRAVENOUS
  Filled 2011-09-18: qty 50

## 2011-09-18 MED ORDER — FENTANYL CITRATE 0.05 MG/ML IJ SOLN
INTRAMUSCULAR | Status: AC
  Administered 2011-09-18: 50 ug via INTRAVENOUS
  Filled 2011-09-18: qty 2

## 2011-09-18 MED ORDER — KETOROLAC TROMETHAMINE 30 MG/ML IJ SOLN: 15.0000 mg | Freq: Once | INTRAMUSCULAR | Status: DC | PRN

## 2011-09-18 MED ORDER — FENTANYL CITRATE 0.05 MG/ML IJ SOLN
INTRAMUSCULAR | Status: DC | PRN
  Administered 2011-09-18: 100 ug via INTRAVENOUS

## 2011-09-18 MED ORDER — LIDOCAINE HCL (CARDIAC) 20 MG/ML IV SOLN
INTRAVENOUS | Status: DC | PRN
  Administered 2011-09-18: 30 mg via INTRAVENOUS

## 2011-09-18 MED ORDER — MIDAZOLAM HCL 5 MG/5ML IJ SOLN
INTRAMUSCULAR | Status: DC | PRN
  Administered 2011-09-18: 2 mg via INTRAVENOUS

## 2011-09-18 MED ORDER — MISOPROSTOL 100 MCG PO TABS
ORAL_TABLET | ORAL | Status: DC | PRN
  Administered 2011-09-18: 800 ug via RECTAL

## 2011-09-18 MED ORDER — ONDANSETRON HCL 4 MG/2ML IJ SOLN
INTRAMUSCULAR | Status: DC | PRN
  Administered 2011-09-18: 4 mg via INTRAVENOUS

## 2011-09-18 MED ORDER — MEPERIDINE HCL 25 MG/ML IJ SOLN
INTRAMUSCULAR | Status: DC | PRN
  Administered 2011-09-18: 6 mg via INTRAVENOUS
  Administered 2011-09-18: 7 mg via INTRAVENOUS
  Administered 2011-09-18 (×2): 6 mg via INTRAVENOUS

## 2011-09-18 MED ORDER — LACTATED RINGERS IV SOLN
INTRAVENOUS | Status: DC
  Administered 2011-09-18 (×2): via INTRAVENOUS

## 2011-09-18 MED ORDER — CITRIC ACID-SODIUM CITRATE 334-500 MG/5ML PO SOLN
30.0000 mL | Freq: Once | ORAL | Status: AC
  Administered 2011-09-18: 30 mL via ORAL
  Filled 2011-09-18: qty 15

## 2011-09-18 MED ORDER — PROPOFOL 10 MG/ML IV EMUL
INTRAVENOUS | Status: DC | PRN
  Administered 2011-09-18: 20 mg via INTRAVENOUS
  Administered 2011-09-18 (×2): 10 mg via INTRAVENOUS
  Administered 2011-09-18 (×5): 20 mg via INTRAVENOUS
  Administered 2011-09-18: 10 mg via INTRAVENOUS
  Administered 2011-09-18 (×2): 20 mg via INTRAVENOUS
  Administered 2011-09-18: 10 mg via INTRAVENOUS
  Administered 2011-09-18: 20 mg via INTRAVENOUS

## 2011-09-18 MED ORDER — SODIUM CHLORIDE 0.9 % IV SOLN
INTRAVENOUS | Status: DC | PRN
  Administered 2011-09-18: 21:00:00 via INTRAMUSCULAR

## 2011-09-18 SURGICAL SUPPLY — 20 items
CATH ROBINSON RED A/P 16FR (CATHETERS) ×2 IMPLANT
CLOTH BEACON ORANGE TIMEOUT ST (SAFETY) ×2 IMPLANT
DECANTER SPIKE VIAL GLASS SM (MISCELLANEOUS) ×2 IMPLANT
DILATOR CANAL MILEX (MISCELLANEOUS) IMPLANT
GAUZE SPONGE 4X4 16PLY XRAY LF (GAUZE/BANDAGES/DRESSINGS) ×2 IMPLANT
GLOVE BIO SURGEON STRL SZ7 (GLOVE) ×4 IMPLANT
GLOVE SKINSENSE STRL SZ6.0 (GLOVE) ×2 IMPLANT
GOWN PREVENTION PLUS LG XLONG (DISPOSABLE) ×2 IMPLANT
KIT BERKELEY 1ST TRIMESTER 3/8 (MISCELLANEOUS) ×2 IMPLANT
NEEDLE SPNL 22GX3.5 QUINCKE BK (NEEDLE) ×2 IMPLANT
NS IRRIG 1000ML POUR BTL (IV SOLUTION) ×2 IMPLANT
PACK VAGINAL MINOR WOMEN LF (CUSTOM PROCEDURE TRAY) ×2 IMPLANT
PAD PREP 24X48 CUFFED NSTRL (MISCELLANEOUS) ×2 IMPLANT
SET BERKELEY SUCTION TUBING (SUCTIONS) ×2 IMPLANT
SYR CONTROL 10ML LL (SYRINGE) ×2 IMPLANT
TOWEL OR 17X24 6PK STRL BLUE (TOWEL DISPOSABLE) ×4 IMPLANT
VACURETTE 10 RIGID CVD (CANNULA) ×2 IMPLANT
VACURETTE 7MM CVD STRL WRAP (CANNULA) IMPLANT
VACURETTE 8 RIGID CVD (CANNULA) IMPLANT
VACURETTE 9 RIGID CVD (CANNULA) IMPLANT

## 2011-09-18 NOTE — Discharge Instructions (Signed)
Postpartum Hemorrhage Postpartum hemorrhage is blood loss of more than 500 mL after childbirth. This is about two cups of blood. Most bleeding happens within 24 hours after birth. This is called primary postpartum hemorrhage. Less commonly, bleeding occurs more than 24 hours after birth, which is called secondary hemorrhage. The most common cause is uterine atony. This means your uterus does not shrink (contract) properly following delivery. When this happens, the blood vessels in the uterus are not squeezed and they keep on bleeding. This is serious. SYMPTOMS  Bleeding after delivery is normal and you should expect vaginal bleeding. This means you will have bleeding coming from the birth canal for many days following childbirth. This is to be expected with both normal births and c-sections.  You are bleeding too much following your delivery if you are:  Passing large clots or pieces of tissue. This may be small pieces of placenta left after delivery.   Soaking more than one sanitary pad per hour for several hours.   Having heavy, bright-red bleeding which occurs four days or more after delivery.   Having a discharge which has a bad smell or if you begin to run an unexplained temperature over 101 F (38.3 C).   Having times of lightheadedness or fainting, feeling short of breath or having your heart beat fast with very little activity.  If you are having any of these symptoms, call your caregiver right away. TREATMENT  Treatments used include medications and blood transfusions. If these treatments do not work, surgery may have to be done to remove the womb (uterus).   Sometimes bleeding occurs if portions of the afterbirth (placenta) are left behind in the uterus following delivery. If this happens, often a curettage or scraping of the inside of the uterus must be done. This usually stops the bleeding.   If bleeding is due to clotting or bleeding problems, which are not related to the  pregnancy, other treatments may be needed.  HOME CARE INSTRUCTIONS   Your caregiver may order bed rest (getting up to the bathroom only), or may allow you to continue light activity.   Keep track of the number of pads you use each day and how soaked (saturated) they are. Write this down.   Do no use tampons. Do not douche or have sexual intercourse until approved by your physician.   Rest and drink extra fluids.   Get proper amounts of rest and exercise.   If you are anemic following your pregnancy, be sure to take the iron and vitamin supplements that your caregiver recommends.   A diet rich in iron, such as spinach, red meat and legumes will be helpful.  SEEK IMMEDIATE MEDICAL CARE IF:  You experience severe cramps in your stomach, back or belly (abdomen).   You run an unexplained temperature of over 101 F (38.3 C) or as told by your caregiver.  You pass large clots or tissue. Save any Postpartum Hemorrhage Postpartum hemorrhage is blood loss of more than 500 mL after childbirth. This is about two cups of blood. Most bleeding happens within 24 hours after birth. This is called primary postpartum hemorrhage. Less commonly, bleeding occurs more than 24 hours after birth, which is called secondary hemorrhage. The most common cause is uterine atony. This means your uterus does not shrink (contract) properly following delivery. When this happens, the blood vessels in the uterus are not squeezed and they keep on bleeding. This is serious. SYMPTOMS  Bleeding after delivery is normal and you  should expect vaginal bleeding. This means you will have bleeding coming from the birth canal for many days following childbirth. This is to be expected with both normal births and c-sections.  You are bleeding too much following your delivery if you are:  Passing large clots or pieces of tissue. This may be small pieces of placenta left after delivery.   Soaking more than one sanitary pad per hour for  several hours.   Having heavy, bright-red bleeding which occurs four days or more after delivery.   Having a discharge which has a bad smell or if you begin to run an unexplained temperature over 101 F (38.3 C).   Having times of lightheadedness or fainting, feeling short of breath or having your heart beat fast with very little activity.  If you are having any of these symptoms, call your caregiver right away. TREATMENT  Treatments used include medications and blood transfusions. If these treatments do not work, surgery may have to be done to remove the womb (uterus).   Sometimes bleeding occurs if portions of the afterbirth (placenta) are left behind in the uterus following delivery. If this happens, often a curettage or scraping of the inside of the uterus must be done. This usually stops the bleeding.   If bleeding is due to clotting or bleeding problems, which are not related to the pregnancy, other treatments may be needed.  HOME CARE INSTRUCTIONS   Your caregiver may order bed rest (getting up to the bathroom only), or may allow you to continue light activity.   Keep track of the number of pads you use each day and how soaked (saturated) they are. Write this down.   Do no use tampons. Do not douche or have sexual intercourse until approved by your physician.   Rest and drink extra fluids.   Get proper amounts of rest and exercise.   If you are anemic following your pregnancy, be sure to take the iron and vitamin supplements that your caregiver recommends.   A diet rich in iron, such as spinach, red meat and legumes will be helpful.  SEEK IMMEDIATE MEDICAL CARE IF:  You experience severe cramps in your stomach, back or belly (abdomen).   You run an unexplained temperature of over 101 F (38.3 C) or as told by your caregiver.   You pass large clots or tissue. Save any tissue for your caregiver to look at.   Your bleeding increases or you become light-headed, weak, or  faint.  Document Released: 08/22/2003 Document Revised: 05/21/2011 Document Reviewed: 03/16/2008  [employer] Patient Information 2012 [employer].tissue for your caregiver to look at.   Your bleeding increases or you become light-headed, weak, or faint.  Document Released: 08/22/2003 Document Revised: 05/21/2011 Document Reviewed: 03/16/2008 [facility] Patient Information 2012 [place], [place].Postpartum Hemorrhage Postpartum hemorrhage is blood loss of more than 500 mL after childbirth. This is about two cups of blood. Most bleeding happens within 24 hours after birth. This is called primary postpartum hemorrhage. Less commonly, bleeding occurs more than 24 hours after birth, which is called secondary hemorrhage. The most common cause is uterine atony. This means your uterus does not shrink (contract) properly following delivery. When this happens, the blood vessels in the uterus are not squeezed and they keep on bleeding. This is serious. SYMPTOMS  Bleeding after delivery is normal and you should expect vaginal bleeding. This means you will have bleeding coming from the birth canal for many days following childbirth. This is to be expected with  both normal births and c-sections.  You are bleeding too much following your delivery if you are:  Passing large clots or pieces of tissue. This may be small pieces of placenta left after delivery.   Soaking more than one sanitary pad per hour for several hours.   Having heavy, bright-red bleeding which occurs four days or more after delivery.   Having a discharge which has a bad smell or if you begin to run an unexplained temperature over 101 F (38.3 C).   Having times of lightheadedness or fainting, feeling short of breath or having your heart beat fast with very little activity.  If you are having any of these symptoms, call your caregiver right away. TREATMENT  Treatments used include medications and blood transfusions. If these treatments do  not work, surgery may have to be done to remove the womb (uterus).   Sometimes bleeding occurs if portions of the afterbirth (placenta) are left behind in the uterus following delivery. If this happens, often a curettage or scraping of the inside of the uterus must be done. This usually stops the bleeding.   If bleeding is due to clotting or bleeding problems, which are not related to the pregnancy, other treatments may be needed.  HOME CARE INSTRUCTIONS   Your caregiver may order bed rest (getting up to the bathroom only), or may allow you to continue light activity.   Keep track of the number of pads you use each day and how soaked (saturated) they are. Write this down.   Do no use tampons. Do not douche or have sexual intercourse until approved by your physician.   Rest and drink extra fluids.   Get proper amounts of rest and exercise.   If you are anemic following your pregnancy, be sure to take the iron and vitamin supplements that your caregiver recommends.   A diet rich in iron, such as spinach, red meat and legumes will be helpful.  SEEK IMMEDIATE MEDICAL CARE IF:  You experience severe cramps in your stomach, back or belly (abdomen).   You run an unexplained temperature of over 101 F (38.3 C) or as told by your caregiver.   You pass large clots or tissue. Save any tissue for your caregiver to look at.   Your bleeding increases or you become light-headed, weak, or faint.  Document Released: 08/22/2003 Document Revised: 05/21/2011 Document Reviewed: 03/16/2008 [facility] Patient Information 2012 [place], [place].

## 2011-09-18 NOTE — H&P (Signed)
CC: Vaginal Bleeding HPI: 21 yo G4P0030 s/p a delivery of 20 week twins on 09/04/11. The patient underwent an attempted cerclage at [facility] at 19 weeks d/t a h/o dynamic cervix and a prior twin pregnancy loss at 20 weeks d/t cervical incompetence. The patient was seen in the office today for increased bleeding.  Speculum exam showed tissue at the cervical os.  This was removed but the physician felt that additional tissue remained.  In MAU, a TVUS was performed at the bedside that confirmed probable retained POCs.  Mgt options were reviewed with the patient and the decision was made to proceed with D&C for treatment.  R/B/A reviewed and the patient wishes to proceed. PMH: 1) Obesity PSH: 1) D&c x 2 for EAB 2) D&E @ 19 wks for twin pregnancy for inevitable abortion POB: as above PE:  Filed Vitals:   09/18/11 1645 09/18/11 1845 09/18/11 1855  BP: 121/64 118/64 154/98  Pulse: 72 62 76  Temp: 97.9 F (36.6 C)    TempSrc: Oral    Resp: 20 16   Height: 5\' 6"  (1.676 m)    Weight: 106.867 kg (235 lb 9.6 oz)    SpO2: 100% 98%   AOX3, NAD Obese soft NT NEFG w/ blood on perineum Vagina + menstrual fluid  TVUS: 2.3 cm thick endometrium, heterogenous in appearance c/w retained POCs  A/P: 21 yo G4P0030 with S/P 20 wk delivery with retained POCs 1) Admit for D&C

## 2011-09-18 NOTE — Anesthesia Postprocedure Evaluation (Signed)
Anesthesia Post Note  Patient: Gabriela Daugherty  Procedure(s) Performed: Procedure(s) (LRB): DILATATION AND EVACUATION (N/A)  Anesthesia type: MAC  Patient location: PACU  Post pain: Pain level controlled  Post assessment: Post-op Vital signs reviewed  Last Vitals:  Filed Vitals:   09/18/11 2130  BP: 110/68  Pulse: 62  Temp: 36.7 C  Resp: 16    Post vital signs: Reviewed  Level of consciousness: sedated  Complications: No apparent anesthesia complications

## 2011-09-18 NOTE — MAU Note (Signed)
Patient states she delivered twins at 20 weeks on 3-22. Was sent from the office today for an add on D/E for retained products. Patient states she is having a lot of cramping, bleeding and passing clots.

## 2011-09-18 NOTE — Op Note (Signed)
Pre-Operative Diagnosis: 1) Vaginal bleeding 2) Retained products of conception Postoperative Diagnosis: same Procedure: Suction D&C with ultrasound guidance Surgeon: Dr. Waynard Reeds Assistant: None Operative Findings: Uterus approximately 10 week size, cervix visually dilated to 1 cm. Products of conception. Specimen: Retained placenta EBL: 300cc  Gabriela Daugherty Is a 21 year old gravida 4 para 46 African American female who presents with increased vaginal bleeding. The patient underwent vaginal delivery of a 20 week twin pregnancy on 09/04/2011. The patient presented to the office today with an increase in vaginal bleeding. On exam products of conception could be visualized at the cervical os these were removed however it was felt that further products of conception were still within the uterus and the patient was sent over to maternity admissions for further evaluation. In MAU, Retained products of conception were confirmed on a bedside ultrasound. Management options were discussed with the patient and the decision was made to proceed with suction D&C. Following the appropriate informed consent patient was taken to the operating room where she was placed in the dorsal lithotomy position in [place] stirrups. IV anesthesia was administered and found to be adequate. She was prepped and draped in the normal sterile fashion. A single-tooth tenaculum was placed on the anterior lip of the cervix. 10 cc 1% lidocaine was injected in a paracervical fashion. The cervix was visually dilated 1 cm. Ring forceps were introduced transcervically, however minimal products of conception were removed therefore the cervix was serially dilated using Hank dilators, a #10 suction curet was passed to transcervically. Vacuum was engaged and a 3 suction passes were performed for removal of products of conception. A sharp curettage was performed followed by a final suction pass. A transabdominal ultrasound was also performed at this  time which demonstrated complete evacuation of the uterus. The patient was experiencing some heavier bleeding at this point. The speculum was removed and a bimanual massage was performed. Bleeding did receive to respond well to bimanual massage. Additionally, 10 cc of a dilute Pitressin solution of 20 units in 100 cc of sterile saline were injected into the cervix and 800 mcg of Cytotec was placed transrectally. The patient's bleeding was well controlled at this point and this concluded the procedure. All sponge needle counts were correct x2 and the patient was taken to the postanesthesia care in stable condition following the procedure.

## 2011-09-18 NOTE — Transfer of Care (Signed)
Immediate Anesthesia Transfer of Care Note  Patient: Gabriela Daugherty  Procedure(s) Performed: Procedure(s) (LRB): DILATATION AND EVACUATION (N/A)  Patient Location: PACU  Anesthesia Type: MAC  Level of Consciousness: awake, alert , oriented and patient cooperative  Airway & Oxygen Therapy: Patient Spontanous Breathing  Post-op Assessment: Report given to PACU RN and Post -op Vital signs reviewed and stable  Post vital signs: Reviewed and stable  Complications: No apparent anesthesia complications

## 2011-09-18 NOTE — Brief Op Note (Signed)
09/18/2011  8:42 PM  PATIENT:  Gabriela Daugherty  21 y.o. female  PRE-OPERATIVE DIAGNOSIS:  Retained Products of Conception  POST-OPERATIVE DIAGNOSIS:  Retained Products of Conception  PROCEDURE:  Procedure(s) (LRB): DILATATION AND EVACUATION (N/A)  SURGEON:  Surgeon(s) and Role:    * Allani Reber H. Tenny Craw, MD - Primary  PHYSICIAN ASSISTANT:   ASSISTANTS: none   ANESTHESIA:   IV sedation and paracervical block  EBL:  Total I/O In: 300 [I.V.:300] Out: 375 [Urine:75; Blood:300]  BLOOD ADMINISTERED:none  DRAINS: none   LOCAL MEDICATIONS USED:  LIDOCAINE   SPECIMEN:  Source of Specimen:  Products of conception  DISPOSITION OF SPECIMEN:  PATHOLOGY  COUNTS:  YES  TOURNIQUET:  * No tourniquets in log *  DICTATION: .Dragon Dictation  PLAN OF CARE: Discharge to home after PACU  PATIENT DISPOSITION:  PACU - hemodynamically stable.   Delay start of Pharmacological VTE agent (>24hrs) due to surgical blood loss or risk of bleeding: not applicable

## 2011-09-18 NOTE — Anesthesia Preprocedure Evaluation (Signed)
Anesthesia Evaluation  Patient identified by MRN, date of birth, ID band Patient awake    Reviewed: Allergy & Precautions, H&P , NPO status , Patient's Chart, lab work & pertinent test results  Airway Mallampati: I TM Distance: >3 FB Neck ROM: full    Dental No notable dental hx. (+) Teeth Intact   Pulmonary neg pulmonary ROS,    Pulmonary exam normal       Cardiovascular negative cardio ROS      Neuro/Psych negative neurological ROS  negative psych ROS   GI/Hepatic negative GI ROS, Neg liver ROS,   Endo/Other  Morbid obesity  Renal/GU negative Renal ROS  negative genitourinary   Musculoskeletal negative musculoskeletal ROS (+)   Abdominal (+) + obese,   Peds negative pediatric ROS (+)  Hematology negative hematology ROS (+)   Anesthesia Other Findings   Reproductive/Obstetrics negative OB ROS                           Anesthesia Physical Anesthesia Plan  ASA: III and Emergent  Anesthesia Plan: MAC   Post-op Pain Management:    Induction: Intravenous  Airway Management Planned:   Additional Equipment:   Intra-op Plan:   Post-operative Plan:   Informed Consent: I have reviewed the patients History and Physical, chart, labs and discussed the procedure including the risks, benefits and alternatives for the proposed anesthesia with the patient or authorized representative who has indicated his/her understanding and acceptance.     Plan Discussed with: CRNA and Surgeon  Anesthesia Plan Comments:         Anesthesia Quick Evaluation

## 2011-09-21 ENCOUNTER — Encounter (HOSPITAL_COMMUNITY): Payer: Self-pay | Admitting: Obstetrics and Gynecology

## 2012-01-06 LAB — OB RESULTS CONSOLE HIV ANTIBODY (ROUTINE TESTING): HIV: NONREACTIVE

## 2012-01-06 LAB — OB RESULTS CONSOLE GBS: GBS: NEGATIVE

## 2012-01-06 LAB — OB RESULTS CONSOLE GC/CHLAMYDIA: Gonorrhea: NEGATIVE

## 2012-01-30 ENCOUNTER — Encounter (HOSPITAL_COMMUNITY): Payer: Self-pay | Admitting: Pharmacist

## 2012-02-08 ENCOUNTER — Encounter (HOSPITAL_COMMUNITY): Payer: Self-pay | Admitting: *Deleted

## 2012-02-12 ENCOUNTER — Ambulatory Visit (HOSPITAL_COMMUNITY)
Admission: RE | Admit: 2012-02-12 | Discharge: 2012-02-12 | Disposition: A | Discharge status: Home / Self Care | Payer: Medicaid Other | Source: Ambulatory Visit | Attending: Obstetrics and Gynecology | Admitting: Obstetrics and Gynecology

## 2012-02-12 ENCOUNTER — Encounter (HOSPITAL_COMMUNITY)
Admission: RE | Disposition: A | Discharge status: Home / Self Care | Payer: Self-pay | Source: Ambulatory Visit | Attending: Obstetrics and Gynecology

## 2012-02-12 ENCOUNTER — Ambulatory Visit (HOSPITAL_COMMUNITY): Payer: Medicaid Other | Admitting: Anesthesiology

## 2012-02-12 ENCOUNTER — Encounter (HOSPITAL_COMMUNITY): Payer: Self-pay | Admitting: Anesthesiology

## 2012-02-12 DIAGNOSIS — O3430 Maternal care for cervical incompetence, unspecified trimester: Secondary | ICD-10-CM | POA: Insufficient documentation

## 2012-02-12 HISTORY — DX: Incompetence of cervix uteri: N88.3

## 2012-02-12 HISTORY — DX: Headache: R51

## 2012-02-12 HISTORY — PX: CERVICAL CERCLAGE: SHX1329

## 2012-02-12 LAB — CBC
Hemoglobin: 12.2 g/dL (ref 12.0–15.0)
MCH: 31 pg (ref 26.0–34.0)
MCHC: 34.7 g/dL (ref 30.0–36.0)
Platelets: 250 10*3/uL (ref 150–400)
RBC: 3.94 MIL/uL (ref 3.87–5.11)

## 2012-02-12 SURGERY — CERCLAGE, CERVIX, VAGINAL APPROACH
Anesthesia: Spinal | Site: Vagina | Wound class: Clean Contaminated

## 2012-02-12 MED ORDER — FENTANYL CITRATE 0.05 MG/ML IJ SOLN: 25.0000 ug | INTRAMUSCULAR | Status: DC | PRN

## 2012-02-12 MED ORDER — FENTANYL CITRATE 0.05 MG/ML IJ SOLN
INTRAMUSCULAR | Status: DC | PRN
  Administered 2012-02-12: 100 ug via INTRAVENOUS

## 2012-02-12 MED ORDER — LACTATED RINGERS IV SOLN: INTRAVENOUS | Status: DC

## 2012-02-12 MED ORDER — 0.9 % SODIUM CHLORIDE (POUR BTL) OPTIME
TOPICAL | Status: DC | PRN
  Administered 2012-02-12: 1000 mL

## 2012-02-12 MED ORDER — CEFAZOLIN SODIUM-DEXTROSE 2-3 GM-% IV SOLR
INTRAVENOUS | Status: AC
  Filled 2012-02-12: qty 50

## 2012-02-12 MED ORDER — KETOROLAC TROMETHAMINE 30 MG/ML IJ SOLN: 15.0000 mg | Freq: Once | INTRAMUSCULAR | Status: DC | PRN

## 2012-02-12 MED ORDER — CEFAZOLIN SODIUM-DEXTROSE 2-3 GM-% IV SOLR
2.0000 g | INTRAVENOUS | Status: AC
  Administered 2012-02-12: 2 g via INTRAVENOUS

## 2012-02-12 MED ORDER — KETOROLAC TROMETHAMINE 30 MG/ML IJ SOLN
INTRAMUSCULAR | Status: AC
  Filled 2012-02-12: qty 1

## 2012-02-12 MED ORDER — LACTATED RINGERS IV SOLN
INTRAVENOUS | Status: DC
  Administered 2012-02-12: 09:00:00 via INTRAVENOUS

## 2012-02-12 MED ORDER — LIDOCAINE IN DEXTROSE 5-7.5 % IV SOLN
INTRAVENOUS | Status: AC
  Filled 2012-02-12: qty 2

## 2012-02-12 MED ORDER — ONDANSETRON HCL 4 MG/2ML IJ SOLN
INTRAMUSCULAR | Status: AC
  Filled 2012-02-12: qty 2

## 2012-02-12 MED ORDER — KETOROLAC TROMETHAMINE 30 MG/ML IJ SOLN
INTRAMUSCULAR | Status: DC | PRN
  Administered 2012-02-12: 30 mg via INTRAVENOUS

## 2012-02-12 MED ORDER — FENTANYL CITRATE 0.05 MG/ML IJ SOLN
INTRAMUSCULAR | Status: AC
  Filled 2012-02-12: qty 2

## 2012-02-12 MED ORDER — ONDANSETRON HCL 4 MG/2ML IJ SOLN
INTRAMUSCULAR | Status: DC | PRN
  Administered 2012-02-12: 4 mg via INTRAVENOUS

## 2012-02-12 MED ORDER — LIDOCAINE IN DEXTROSE 5-7.5 % IV SOLN
INTRAVENOUS | Status: DC | PRN
  Administered 2012-02-12: 50 mg via INTRATHECAL

## 2012-02-12 SURGICAL SUPPLY — 17 items
CATH ROBINSON RED A/P 16FR (CATHETERS) ×2 IMPLANT
CLOTH BEACON ORANGE TIMEOUT ST (SAFETY) ×2 IMPLANT
COUNTER NEEDLE 1200 MAGNETIC (NEEDLE) ×2 IMPLANT
GLOVE BIO SURGEON STRL SZ8 (GLOVE) ×2 IMPLANT
GLOVE ORTHO TXT STRL SZ7.5 (GLOVE) ×2 IMPLANT
GOWN PREVENTION PLUS LG XLONG (DISPOSABLE) ×2 IMPLANT
NEEDLE MAYO .5 CIRCLE (NEEDLE) IMPLANT
NS IRRIG 1000ML POUR BTL (IV SOLUTION) ×2 IMPLANT
PACK VAGINAL MINOR WOMEN LF (CUSTOM PROCEDURE TRAY) ×2 IMPLANT
PAD PREP 24X48 CUFFED NSTRL (MISCELLANEOUS) ×2 IMPLANT
SUT PROLENE 1 CTX 30  8455H (SUTURE) ×1
SUT PROLENE 1 CTX 30 8455H (SUTURE) ×1 IMPLANT
SYRINGE TOOMEY DISP (SYRINGE) ×2 IMPLANT
TOWEL OR 17X24 6PK STRL BLUE (TOWEL DISPOSABLE) ×4 IMPLANT
TUBING NON-CON 1/4 X 20 CONN (TUBING) ×2 IMPLANT
WATER STERILE IRR 1000ML POUR (IV SOLUTION) IMPLANT
YANKAUER SUCT BULB TIP NO VENT (SUCTIONS) ×2 IMPLANT

## 2012-02-12 NOTE — Anesthesia Postprocedure Evaluation (Signed)
Anesthesia Post Note  Patient: Gabriela Daugherty  Procedure(s) Performed: Procedure(s) (LRB): CERCLAGE CERVICAL (N/A)  Anesthesia type: Spinal  Patient location: PACU  Post pain: Pain level controlled  Post assessment: Post-op Vital signs reviewed  Last Vitals:  Filed Vitals:   02/12/12 1100  BP: 104/55  Pulse: 65  Temp: 37.1 C  Resp: 16    Post vital signs: Reviewed  Level of consciousness: awake  Complications: No apparent anesthesia complications

## 2012-02-12 NOTE — Transfer of Care (Signed)
Immediate Anesthesia Transfer of Care Note  Patient: Gabriela Daugherty  Procedure(s) Performed: Procedure(s) (LRB): CERCLAGE CERVICAL (N/A)  Patient Location: PACU  Anesthesia Type: Spinal  Level of Consciousness: awake, alert  and oriented  Airway & Oxygen Therapy: Patient Spontanous Breathing and Patient connected to nasal cannula oxygen  Post-op Assessment: Report given to PACU RN and Post -op Vital signs reviewed and stable  Post vital signs: Reviewed and stable  Complications: No apparent anesthesia complications

## 2012-02-12 NOTE — H&P (Signed)
Gabriela Daugherty is a 21 y.o. female, G5 P0220, EGA 14+ weeks presenting for cervical cerclage.  Had a 19 week loss of twins, the had attempted cerclage with the next twin pregnancy and membranes ruptured during the procedure, she lost one twin at 20 weeks and the second twin at 24 weeks.  We have discussed options with probable incompetent cervix, she wants to proceed with cerclage with this pregnancy.  History OB History    Grav Para Term Preterm Abortions TAB SAB Ect Mult Living   6 1 0 0 3 2 1 0 2 2      Past Medical History  Diagnosis Date  . Headache   . Incompetent cervix    Past Surgical History  Procedure Date  . Dilation and evacuation 09/18/2011    Procedure: DILATATION AND EVACUATION;  Surgeon: Freddrick March. Tenny Craw, MD;  Location: [facility];  Service: Gynecology;  Laterality: N/A;  . Dilation and curettage of uterus   . Cerclage removal    Family History: family history includes Depression in her mother; Diabetes in her father; Hypertension in her father and mother; and Stroke in her father.  There is no history of Anesthesia problems. Social History:  reports that she has never smoked. She has never used smokeless tobacco. She reports that she uses illicit drugs (Marijuana). She reports that she does not drink alcohol.    Review of Systems  Respiratory: Negative.   Cardiovascular: Negative.       Height 5' 6.5" (1.689 m), weight 112.038 kg (247 lb), last menstrual period 04/15/2011. Exam Physical Exam  Constitutional: She appears well-developed and well-nourished.  Neck: Neck supple. No thyromegaly present.  Cardiovascular: Normal rate, regular rhythm and normal heart sounds.   No murmur heard. Respiratory: Effort normal and breath sounds normal. No respiratory distress. She has no wheezes.  GI: Soft.       Gravid  Genitourinary: Vagina normal.       Cervix long and closed    Prenatal labs: ABO, Rh: --/--/O POS (12/19 1955) Antibody:   Rubella:   RPR: NON REACTIVE  (03/22 0135)  HBsAg:    HIV:    GBS:     Assessment/Plan: IUP at 14+ weeks with incompetent cervix.  Cervical cerclage and bedrest have been discussed.  Cerclage procedure and risks have been discussed.  Will admit for cerclage.     Brayton Baumgartner D 02/12/2012, 6:40 AM

## 2012-02-12 NOTE — Anesthesia Preprocedure Evaluation (Addendum)
Anesthesia Evaluation  Patient identified by MRN, date of birth, ID band Patient awake    Reviewed: Allergy & Precautions, H&P , NPO status , Patient's Chart, lab work & pertinent test results, reviewed documented beta blocker date and time   History of Anesthesia Complications Negative for: history of anesthetic complications  Airway Mallampati: I TM Distance: >3 FB Neck ROM: full    Dental  (+) Teeth Intact   Pulmonary neg pulmonary ROS,  breath sounds clear to auscultation        Cardiovascular negative cardio ROS  Rhythm:regular Rate:Normal     Neuro/Psych  Headaches (has a headache now, has frequent headaches), negative psych ROS   GI/Hepatic negative GI ROS, (+)     substance abuse  marijuana use,   Endo/Other  Morbid obesity  Renal/GU negative Renal ROS  negative genitourinary   Musculoskeletal   Abdominal   Peds  Hematology negative hematology ROS (+)   Anesthesia Other Findings   Reproductive/Obstetrics (+) Pregnancy (14 weeks, incompetent cervix)                         Anesthesia Physical Anesthesia Plan  ASA: III  Anesthesia Plan: Spinal   Post-op Pain Management:    Induction:   Airway Management Planned:   Additional Equipment:   Intra-op Plan:   Post-operative Plan:   Informed Consent: I have reviewed the patients History and Physical, chart, labs and discussed the procedure including the risks, benefits and alternatives for the proposed anesthesia with the patient or authorized representative who has indicated his/her understanding and acceptance.   Dental Advisory Given  Plan Discussed with: CRNA and Surgeon  Anesthesia Plan Comments:         Anesthesia Quick Evaluation

## 2012-02-12 NOTE — Interval H&P Note (Signed)
History and Physical Interval Note:  02/12/2012 9:12 AM  Gabriela Daugherty  has presented today for surgery, with the diagnosis of incompetent cervix  The various methods of treatment have been discussed with the patient and family. After consideration of risks, benefits and other options for treatment, the patient has consented to  Procedure(s) (LRB): CERCLAGE CERVICAL (N/A) as a surgical intervention .  The patient's history has been reviewed, patient examined, no change in status, stable for surgery.  I have reviewed the patient's chart and labs.  Questions were answered to the patient's satisfaction.     [name]

## 2012-02-12 NOTE — Anesthesia Procedure Notes (Signed)
Spinal  Patient location during procedure: OR Start time: 02/12/2012 9:40 AM Staffing Performed by: anesthesiologist  Preanesthetic Checklist Completed: patient identified, site marked, surgical consent, pre-op evaluation, timeout performed, IV checked, risks and benefits discussed and monitors and equipment checked Spinal Block Patient position: sitting Prep: site prepped and draped and DuraPrep Patient monitoring: heart rate, cardiac monitor, continuous pulse ox and blood pressure Approach: midline Location: L3-4 Injection technique: single-shot Needle Needle type: Sprotte  Needle gauge: 24 G Needle length: 9 cm Assessment Sensory level: T4 Additional Notes Clear free flow CSF on first attempt.  No paresthesia.  Patient tolerated procedure well.  Jasmine December, MD

## 2012-02-12 NOTE — Op Note (Signed)
Preoperative diagnosis: Intrauterine pregnancy at 14 weeks, history of incompetent cervix Postoperative diagnosis: Same Procedure: McDonald cervical cerclage Surgeon: Lavina Hamman M.D. Anesthesia: Spinal Findings: Cervix at this point is fairly long and closed and the cerclage appeared to be in good position after placement Specimens: None Estimated blood loss: 50 cc Complications: None  Procedure in detail:  The patient was taken to the operating room and placed in the sitting position. Dr. Rodman Pickle instilled spinal anesthesia. She was then placed in the dorsosupine position and legs were placed in mobile stirrups and elevated. Perineum and vagina were then prepped and draped in usual sterile fashion and bladder drained with a red robinson catheter. The level of her anesthesia was found to be adequate. A weighted speculum was inserted in the vagina and a Deaver retractor used anteriorly and laterally. The vagina was copiously irrigated with warm sterile saline.  Again exam confirmed the cervix to be closed.  A #1 Prolene suture was used to place a McDonald cerclage in a counterclockwise direction starting at 11:00  And taking 4 bites around the cervix and tying the knot anteriorly. The cerclage appeared to be in good position, the cervix was still closed.  The vagina was again irrigated with warm saline. Again the cerclage appeared in good position and there was adequate hemostasis. All instruments were removed from the vagina. The patient was taken down from stirrups. She was taken to the recovery room in stable condition after tolerating the procedure well. She received Ancef 2 g IV the beginning of the procedure and had PAS hose on throughout the procedure.

## 2012-02-15 ENCOUNTER — Encounter (HOSPITAL_COMMUNITY): Payer: Self-pay | Admitting: Obstetrics and Gynecology

## 2012-06-15 NOTE — L&D Delivery Note (Signed)
Delivery Note Pt spiked a temperature to 102 shortly before delivery and was given tylenol and changed to Unasyn for antibiotic coverage for presumed chorioamnionitis. The patient pushed about 10 minutes and with NICU in attendance at 4:48 PM a viable female was delivered via Vaginal, Spontaneous Delivery (Presentation: ; Occiput Anterior).  APGAR: 7, 8; weight pending.  The NICU team checked the baby and felt she looked good. Placenta status: Intact, manually extracted Cord: 3 vessels with the following complications: Nuchal x 1 delivered through. Terminal meconium noted.  Anesthesia: Epidural  Episiotomy: None Lacerations: 2nd degree Suture Repair: 3.0 vicryl rapide Est. Blood Loss (mL): 400cc  Mom to postpartum.  Baby to to stay with mother.  Oliver Pila 08/01/2012, 5:25 PM

## 2012-07-17 ENCOUNTER — Inpatient Hospital Stay (HOSPITAL_COMMUNITY)
Admission: AD | Admit: 2012-07-17 | Discharge: 2012-07-17 | Disposition: A | Discharge status: Home / Self Care | Payer: Medicaid Other | Source: Ambulatory Visit | Attending: Obstetrics and Gynecology | Admitting: Obstetrics and Gynecology

## 2012-07-17 ENCOUNTER — Encounter (HOSPITAL_COMMUNITY): Payer: Self-pay

## 2012-07-17 DIAGNOSIS — O479 False labor, unspecified: Secondary | ICD-10-CM | POA: Insufficient documentation

## 2012-07-17 DIAGNOSIS — Z22330 Carrier of Group B streptococcus: Secondary | ICD-10-CM | POA: Insufficient documentation

## 2012-07-17 DIAGNOSIS — O99891 Other specified diseases and conditions complicating pregnancy: Secondary | ICD-10-CM | POA: Insufficient documentation

## 2012-07-17 LAB — OB RESULTS CONSOLE GBS: GBS: POSITIVE

## 2012-07-17 NOTE — MAU Note (Signed)
Contractions q 4-6 minutes. Denies problems with preg, but is GBS positive.

## 2012-07-28 ENCOUNTER — Encounter (HOSPITAL_COMMUNITY): Payer: Self-pay | Admitting: Advanced Practice Midwife

## 2012-07-28 ENCOUNTER — Inpatient Hospital Stay (HOSPITAL_COMMUNITY)
Admission: AD | Admit: 2012-07-28 | Discharge: 2012-07-28 | Disposition: A | Discharge status: Home / Self Care | Payer: Medicaid Other | Source: Ambulatory Visit | Attending: Obstetrics and Gynecology | Admitting: Obstetrics and Gynecology

## 2012-07-28 DIAGNOSIS — O133 Gestational [pregnancy-induced] hypertension without significant proteinuria, third trimester: Secondary | ICD-10-CM

## 2012-07-28 DIAGNOSIS — O139 Gestational [pregnancy-induced] hypertension without significant proteinuria, unspecified trimester: Secondary | ICD-10-CM | POA: Insufficient documentation

## 2012-07-28 LAB — CBC
MCHC: 32.8 g/dL (ref 30.0–36.0)
RDW: 13.8 % (ref 11.5–15.5)
WBC: 12.5 10*3/uL — ABNORMAL HIGH (ref 4.0–10.5)

## 2012-07-28 LAB — COMPREHENSIVE METABOLIC PANEL
ALT: 7 U/L (ref 0–35)
AST: 8 U/L (ref 0–37)
Albumin: 2.1 g/dL — ABNORMAL LOW (ref 3.5–5.2)
Alkaline Phosphatase: 164 U/L — ABNORMAL HIGH (ref 39–117)
Chloride: 102 mEq/L (ref 96–112)
Potassium: 3.9 mEq/L (ref 3.5–5.1)
Sodium: 135 mEq/L (ref 135–145)
Total Bilirubin: 0.2 mg/dL — ABNORMAL LOW (ref 0.3–1.2)
Total Protein: 5.9 g/dL — ABNORMAL LOW (ref 6.0–8.3)

## 2012-07-28 LAB — URINALYSIS, ROUTINE W REFLEX MICROSCOPIC
Bilirubin Urine: NEGATIVE
Glucose, UA: NEGATIVE mg/dL
Hgb urine dipstick: NEGATIVE
Ketones, ur: NEGATIVE mg/dL
pH: 6.5 (ref 5.0–8.0)

## 2012-07-28 LAB — PROTEIN / CREATININE RATIO, URINE: Total Protein, Urine: 74.9 mg/dL

## 2012-07-28 LAB — URINE MICROSCOPIC-ADD ON

## 2012-07-28 NOTE — MAU Note (Signed)
Patient states she was to have an office visit today to check her blood pressure and possibly induce labor. Patient denies any bleeding, leaking or contractions but does have a headache. Reports good fetal movement.

## 2012-07-28 NOTE — MAU Provider Note (Signed)
Chief Complaint:  Hypertension and Headache   First Provider Initiated Contact with Patient 07/28/12 1459     HPI: Gabriela Daugherty is a 22 y.o. [id number] at [redacted]w[redacted]d who presents to maternity admissions for blood pressure check. Was supposed to have office visit and possibly schedule induction of labor. Moderate generalized throbbing HA last night, similar to HA's throughout pregnancy. Temporary relief w/ Tylenol. Resolved w/ sleep. Mild HA now. Declines meds.  Past Medical History: Past Medical History  Diagnosis Date  . Headache   . Incompetent cervix   . Preterm labor     Past obstetric history: OB History   Grav Para Term Preterm Abortions TAB SAB Ect Mult Living   5 1 0 1 3 2 1 0 2 0      # Outc Date GA Lbr Len/2nd Wgt Sex Del Anes PTL Lv   1A PRE 3/13  25:30 / 00:16 0.981XB(14.7WG) M SVD EPI  ND   Comments: 20wks 2 days   1B  3/13  25:30 / 00:15 9.562ZH(08MV) M SVD EPI  ND   Comments: 20wks 2 days   2 TAB            3 TAB            4 SAB  [redacted]w[redacted]d          Comments: twins, 19wks. D&C   5 CUR               Past Surgical History: Past Surgical History  Procedure Laterality Date  . Dilation and evacuation  09/18/2011    Procedure: DILATATION AND EVACUATION;  Surgeon: Freddrick March. Tenny Craw, MD;  Location: [facility];  Service: Gynecology;  Laterality: N/A;  . Dilation and curettage of uterus    . Cerclage removal    . Cervical cerclage  02/12/2012    Procedure: CERCLAGE CERVICAL;  Surgeon: Lavina Hamman, MD;  Location: [facility];  Service: Gynecology;  Laterality: N/A;    Family History: Family History  Problem Relation Age of Onset  . Hypertension Mother   . Depression Mother   . Hypertension Father   . Diabetes Father   . Stroke Father   . Anesthesia problems Neg Hx     Social History: History  Substance Use Topics  . Smoking status: Never Smoker   . Smokeless tobacco: Never Used  . Alcohol Use: No    Allergies: No Known Allergies  Meds:  No prescriptions prior to  admission    ROS: Denies epigastric pain, vision changes, contractions, VB, LOF.   Physical Exam  Blood pressure 137/75, pulse 92, resp. rate 18, height 5' 6.5" (1.689 m), weight 128.64 kg (283 lb 9.6 oz), last menstrual period 04/15/2011, SpO2 100.00%. GENERAL: Well-developed, well-nourished female in no acute distress.  HEENT: normocephalic HEART: normal rate RESP: normal effort ABDOMEN: Soft, non-tender, gravid appropriate for gestational age EXTREMITIES: Nontender, 1+ edema NEURO: alert and oriented, DTR's 2+, no clonus SPECULUM EXAM: Deferred  FHT:  Baseline 130-140 , moderate variability, accelerations present, no decelerations Contractions: None   Labs: Results for orders placed during the hospital encounter of 07/28/12 (from the past 24 hour(s))  PROTEIN / CREATININE RATIO, URINE     Status: Abnormal   Collection Time    07/28/12 12:35 PM      Result Value Range   Creatinine, Urine 239.99     Total Protein, Urine 74.9     PROTEIN CREATININE RATIO 0.31 (*) 0.00 - 0.15  URINALYSIS, ROUTINE W REFLEX MICROSCOPIC  Status: Abnormal   Collection Time    07/28/12  1:19 PM      Result Value Range   Color, Urine YELLOW  YELLOW   APPearance HAZY (*) CLEAR   Specific Gravity, Urine 1.025  1.005 - 1.030   pH 6.5  5.0 - 8.0   Glucose, UA NEGATIVE  NEGATIVE mg/dL   Hgb urine dipstick NEGATIVE  NEGATIVE   Bilirubin Urine NEGATIVE  NEGATIVE   Ketones, ur NEGATIVE  NEGATIVE mg/dL   Protein, ur 30 (*) NEGATIVE mg/dL   Urobilinogen, UA 0.2  0.0 - 1.0 mg/dL   Nitrite NEGATIVE  NEGATIVE   Leukocytes, UA NEGATIVE  NEGATIVE  URINE MICROSCOPIC-ADD ON     Status: Abnormal   Collection Time    07/28/12  1:19 PM      Result Value Range   Squamous Epithelial / LPF MANY (*) RARE   WBC, UA 3-6  <3 WBC/hpf   Bacteria, UA MANY (*) RARE  CBC     Status: Abnormal   Collection Time    07/28/12  1:35 PM      Result Value Range   WBC 12.5 (*) 4.0 - 10.5 K/uL   RBC 3.53 (*) 3.87 - 5.11  MIL/uL   Hemoglobin 10.3 (*) 12.0 - 15.0 g/dL   HCT 82.9 (*) 56.2 - 13.0 %   MCV 89.0  78.0 - 100.0 fL   MCH 29.2  26.0 - 34.0 pg   MCHC 32.8  30.0 - 36.0 g/dL   RDW 86.5  78.4 - 69.6 %   Platelets 248  150 - 400 K/uL  COMPREHENSIVE METABOLIC PANEL     Status: Abnormal   Collection Time    07/28/12  1:35 PM      Result Value Range   Sodium 135  135 - 145 mEq/L   Potassium 3.9  3.5 - 5.1 mEq/L   Chloride 102  96 - 112 mEq/L   CO2 24  19 - 32 mEq/L   Glucose, Bld 111 (*) 70 - 99 mg/dL   BUN 4 (*) 6 - 23 mg/dL   Creatinine, Ser 2.95  0.50 - 1.10 mg/dL   Calcium 9.1  8.4 - 28.4 mg/dL   Total Protein 5.9 (*) 6.0 - 8.3 g/dL   Albumin 2.1 (*) 3.5 - 5.2 g/dL   AST 8  0 - 37 U/L   ALT 7  0 - 35 U/L   Alkaline Phosphatase 164 (*) 39 - 117 U/L   Total Bilirubin 0.2 (*) 0.3 - 1.2 mg/dL   GFR calc non Af Amer >90  >90 mL/min   GFR calc Af Amer >90  >90 mL/min  URIC ACID     Status: None   Collection Time    07/28/12  1:35 PM      Result Value Range   Uric Acid, Serum 6.1  2.4 - 7.0 mg/dL  LACTATE DEHYDROGENASE     Status: None   Collection Time    07/28/12  1:35 PM      Result Value Range   LDH 177  94 - 250 U/L    Imaging:  No results found. MAU Course:   Assessment: 1. Gestational hypertension w/o significant proteinuria in 3rd trimester    Plan: Discharge home Labor, Pre-eclampsia precautions and fetal kick counts     Follow-up Information   Call MEISINGER,TODD D, MD. (to schedule appt on 08/01/12)    Contact information:   [address], [address] [place]  [address] [phone]        Medication List    TAKE these medications       acetaminophen 325 MG tablet  Commonly known as:  TYLENOL  Take 650 mg by mouth every 6 (six) hours as needed. For pain     prenatal multivitamin Tabs  Take 1 tablet by mouth daily.        [place], [place] 07/28/2012 6:51 PM

## 2012-07-29 NOTE — Progress Notes (Signed)
FHT from 2-13 reviewed.  Reactive NST, no reg ctx.

## 2012-07-30 LAB — URINE CULTURE: Colony Count: 80000

## 2012-08-01 ENCOUNTER — Encounter (HOSPITAL_COMMUNITY): Payer: Self-pay | Admitting: Anesthesiology

## 2012-08-01 ENCOUNTER — Inpatient Hospital Stay (HOSPITAL_COMMUNITY)
Admission: AD | Admit: 2012-08-01 | Discharge: 2012-08-03 | DRG: 774 | Disposition: A | Discharge status: Home / Self Care | Payer: Medicaid Other | Source: Ambulatory Visit | Attending: Obstetrics and Gynecology | Admitting: Obstetrics and Gynecology

## 2012-08-01 ENCOUNTER — Inpatient Hospital Stay (HOSPITAL_COMMUNITY): Payer: Medicaid Other | Admitting: Anesthesiology

## 2012-08-01 ENCOUNTER — Encounter (HOSPITAL_COMMUNITY): Payer: Self-pay | Admitting: *Deleted

## 2012-08-01 DIAGNOSIS — O3430 Maternal care for cervical incompetence, unspecified trimester: Secondary | ICD-10-CM | POA: Diagnosis present

## 2012-08-01 DIAGNOSIS — O99892 Other specified diseases and conditions complicating childbirth: Secondary | ICD-10-CM | POA: Diagnosis present

## 2012-08-01 DIAGNOSIS — O411290 Chorioamnionitis, unspecified trimester, not applicable or unspecified: Secondary | ICD-10-CM

## 2012-08-01 DIAGNOSIS — O411090 Infection of amniotic sac and membranes, unspecified, unspecified trimester, not applicable or unspecified: Secondary | ICD-10-CM | POA: Diagnosis present

## 2012-08-01 DIAGNOSIS — O139 Gestational [pregnancy-induced] hypertension without significant proteinuria, unspecified trimester: Secondary | ICD-10-CM | POA: Diagnosis present

## 2012-08-01 DIAGNOSIS — Z22330 Carrier of Group B streptococcus: Secondary | ICD-10-CM

## 2012-08-01 LAB — URINALYSIS, ROUTINE W REFLEX MICROSCOPIC
Leukocytes, UA: NEGATIVE
Nitrite: NEGATIVE
Specific Gravity, Urine: 1.02 (ref 1.005–1.030)
Urobilinogen, UA: 0.2 mg/dL (ref 0.0–1.0)
pH: 6 (ref 5.0–8.0)

## 2012-08-01 LAB — COMPREHENSIVE METABOLIC PANEL
Albumin: 2.5 g/dL — ABNORMAL LOW (ref 3.5–5.2)
Alkaline Phosphatase: 181 U/L — ABNORMAL HIGH (ref 39–117)
BUN: 5 mg/dL — ABNORMAL LOW (ref 6–23)
Potassium: 4.1 mEq/L (ref 3.5–5.1)
Sodium: 133 mEq/L — ABNORMAL LOW (ref 135–145)
Total Protein: 6.8 g/dL (ref 6.0–8.3)

## 2012-08-01 LAB — CBC
MCV: 88.5 fL (ref 78.0–100.0)
Platelets: 282 10*3/uL (ref 150–400)
RDW: 13.9 % (ref 11.5–15.5)
WBC: 19.3 10*3/uL — ABNORMAL HIGH (ref 4.0–10.5)

## 2012-08-01 LAB — LACTATE DEHYDROGENASE: LDH: 278 U/L — ABNORMAL HIGH (ref 94–250)

## 2012-08-01 LAB — TYPE AND SCREEN: Antibody Screen: NEGATIVE

## 2012-08-01 LAB — RPR: RPR Ser Ql: NONREACTIVE

## 2012-08-01 LAB — URIC ACID: Uric Acid, Serum: 6.2 mg/dL (ref 2.4–7.0)

## 2012-08-01 MED ORDER — ACETAMINOPHEN 325 MG PO TABS
325.0000 mg | ORAL_TABLET | Freq: Four times a day (QID) | ORAL | Status: DC | PRN
  Administered 2012-08-01: 325 mg via ORAL
  Filled 2012-08-01: qty 1

## 2012-08-01 MED ORDER — ZOLPIDEM TARTRATE 5 MG PO TABS: 5.0000 mg | ORAL_TABLET | Freq: Every evening | ORAL | Status: DC | PRN

## 2012-08-01 MED ORDER — DIPHENHYDRAMINE HCL 25 MG PO CAPS: 25.0000 mg | ORAL_CAPSULE | Freq: Four times a day (QID) | ORAL | Status: DC | PRN

## 2012-08-01 MED ORDER — LIDOCAINE HCL (PF) 1 % IJ SOLN
30.0000 mL | INTRAMUSCULAR | Status: DC | PRN
  Filled 2012-08-01: qty 30

## 2012-08-01 MED ORDER — DEXTROSE 5 % IV SOLN
5.0000 10*6.[IU] | Freq: Once | INTRAVENOUS | Status: AC
  Administered 2012-08-01: 5 10*6.[IU] via INTRAVENOUS
  Filled 2012-08-01: qty 5

## 2012-08-01 MED ORDER — AMPICILLIN-SULBACTAM SODIUM 3 (2-1) G IJ SOLR
3.0000 g | Freq: Four times a day (QID) | INTRAMUSCULAR | Status: AC
  Administered 2012-08-01 – 2012-08-02 (×5): 3 g via INTRAVENOUS
  Filled 2012-08-01 (×5): qty 3

## 2012-08-01 MED ORDER — PHENYLEPHRINE 40 MCG/ML (10ML) SYRINGE FOR IV PUSH (FOR BLOOD PRESSURE SUPPORT): 80.0000 ug | PREFILLED_SYRINGE | INTRAVENOUS | Status: DC | PRN

## 2012-08-01 MED ORDER — ACETAMINOPHEN 325 MG PO TABS
650.0000 mg | ORAL_TABLET | ORAL | Status: DC | PRN
  Administered 2012-08-01: 650 mg via ORAL
  Filled 2012-08-01: qty 2

## 2012-08-01 MED ORDER — WITCH HAZEL-GLYCERIN EX PADS: 1.0000 "application " | MEDICATED_PAD | CUTANEOUS | Status: DC | PRN

## 2012-08-01 MED ORDER — OXYCODONE-ACETAMINOPHEN 5-325 MG PO TABS
1.0000 | ORAL_TABLET | ORAL | Status: DC | PRN
  Administered 2012-08-01: 1 via ORAL
  Filled 2012-08-01: qty 1

## 2012-08-01 MED ORDER — EPHEDRINE 5 MG/ML INJ
10.0000 mg | INTRAVENOUS | Status: DC | PRN
  Filled 2012-08-01: qty 4

## 2012-08-01 MED ORDER — PHENYLEPHRINE 40 MCG/ML (10ML) SYRINGE FOR IV PUSH (FOR BLOOD PRESSURE SUPPORT)
80.0000 ug | PREFILLED_SYRINGE | INTRAVENOUS | Status: DC | PRN
  Filled 2012-08-01: qty 5

## 2012-08-01 MED ORDER — TERBUTALINE SULFATE 1 MG/ML IJ SOLN: 0.2500 mg | Freq: Once | INTRAMUSCULAR | Status: DC | PRN

## 2012-08-01 MED ORDER — SIMETHICONE 80 MG PO CHEW: 80.0000 mg | CHEWABLE_TABLET | ORAL | Status: DC | PRN

## 2012-08-01 MED ORDER — BENZOCAINE-MENTHOL 20-0.5 % EX AERO: 1.0000 "application " | INHALATION_SPRAY | CUTANEOUS | Status: DC | PRN

## 2012-08-01 MED ORDER — LACTATED RINGERS IV SOLN
500.0000 mL | INTRAVENOUS | Status: DC | PRN
  Administered 2012-08-01: 1000 mL via INTRAVENOUS
  Administered 2012-08-01: 500 mL via INTRAVENOUS

## 2012-08-01 MED ORDER — IBUPROFEN 600 MG PO TABS
600.0000 mg | ORAL_TABLET | Freq: Four times a day (QID) | ORAL | Status: DC | PRN
  Administered 2012-08-01: 600 mg via ORAL
  Filled 2012-08-01: qty 1

## 2012-08-01 MED ORDER — SENNOSIDES-DOCUSATE SODIUM 8.6-50 MG PO TABS
2.0000 | ORAL_TABLET | Freq: Every day | ORAL | Status: DC
  Administered 2012-08-03: 2 via ORAL

## 2012-08-01 MED ORDER — OXYTOCIN 40 UNITS IN LACTATED RINGERS INFUSION - SIMPLE MED: 62.5000 mL/h | INTRAVENOUS | Status: DC

## 2012-08-01 MED ORDER — CITRIC ACID-SODIUM CITRATE 334-500 MG/5ML PO SOLN
30.0000 mL | ORAL | Status: DC | PRN
  Filled 2012-08-01: qty 15

## 2012-08-01 MED ORDER — ONDANSETRON HCL 4 MG/2ML IJ SOLN: 4.0000 mg | Freq: Four times a day (QID) | INTRAMUSCULAR | Status: DC | PRN

## 2012-08-01 MED ORDER — ONDANSETRON HCL 4 MG PO TABS: 4.0000 mg | ORAL_TABLET | ORAL | Status: DC | PRN

## 2012-08-01 MED ORDER — DIPHENHYDRAMINE HCL 50 MG/ML IJ SOLN: 12.5000 mg | INTRAMUSCULAR | Status: DC | PRN

## 2012-08-01 MED ORDER — OXYTOCIN 40 UNITS IN LACTATED RINGERS INFUSION - SIMPLE MED
1.0000 m[IU]/min | INTRAVENOUS | Status: DC
  Filled 2012-08-01: qty 1000

## 2012-08-01 MED ORDER — IBUPROFEN 600 MG PO TABS
600.0000 mg | ORAL_TABLET | Freq: Four times a day (QID) | ORAL | Status: DC
  Administered 2012-08-02 – 2012-08-03 (×6): 600 mg via ORAL
  Filled 2012-08-01 (×6): qty 1

## 2012-08-01 MED ORDER — LANOLIN HYDROUS EX OINT: TOPICAL_OINTMENT | CUTANEOUS | Status: DC | PRN

## 2012-08-01 MED ORDER — OXYTOCIN BOLUS FROM INFUSION: 500.0000 mL | INTRAVENOUS | Status: DC

## 2012-08-01 MED ORDER — PRENATAL MULTIVITAMIN CH
1.0000 | ORAL_TABLET | Freq: Every day | ORAL | Status: DC
  Administered 2012-08-02 – 2012-08-03 (×2): 1 via ORAL
  Filled 2012-08-01 (×2): qty 1

## 2012-08-01 MED ORDER — PENICILLIN G POTASSIUM 5000000 UNITS IJ SOLR
2.5000 10*6.[IU] | INTRAVENOUS | Status: DC
  Administered 2012-08-01: 2.5 10*6.[IU] via INTRAVENOUS
  Filled 2012-08-01 (×4): qty 2.5

## 2012-08-01 MED ORDER — LACTATED RINGERS IV SOLN
500.0000 mL | Freq: Once | INTRAVENOUS | Status: AC
  Administered 2012-08-01: 11:00:00 via INTRAVENOUS

## 2012-08-01 MED ORDER — FENTANYL 2.5 MCG/ML BUPIVACAINE 1/10 % EPIDURAL INFUSION (WH - ANES)
14.0000 mL/h | INTRAMUSCULAR | Status: DC
  Administered 2012-08-01 (×2): 14 mL/h via EPIDURAL
  Filled 2012-08-01 (×2): qty 125

## 2012-08-01 MED ORDER — FLEET ENEMA 7-19 GM/118ML RE ENEM: 1.0000 | ENEMA | RECTAL | Status: DC | PRN

## 2012-08-01 MED ORDER — ONDANSETRON HCL 4 MG/2ML IJ SOLN: 4.0000 mg | INTRAMUSCULAR | Status: DC | PRN

## 2012-08-01 MED ORDER — DIBUCAINE 1 % RE OINT: 1.0000 "application " | TOPICAL_OINTMENT | RECTAL | Status: DC | PRN

## 2012-08-01 MED ORDER — EPHEDRINE 5 MG/ML INJ: 10.0000 mg | INTRAVENOUS | Status: DC | PRN

## 2012-08-01 MED ORDER — PRENATAL MULTIVITAMIN CH: 1.0000 | ORAL_TABLET | Freq: Every day | ORAL | Status: DC

## 2012-08-01 MED ORDER — OXYCODONE-ACETAMINOPHEN 5-325 MG PO TABS
1.0000 | ORAL_TABLET | ORAL | Status: DC | PRN
  Administered 2012-08-01 – 2012-08-02 (×2): 1 via ORAL
  Filled 2012-08-01 (×2): qty 1

## 2012-08-01 MED ORDER — TETANUS-DIPHTH-ACELL PERTUSSIS 5-2.5-18.5 LF-MCG/0.5 IM SUSP: 0.5000 mL | Freq: Once | INTRAMUSCULAR | Status: DC

## 2012-08-01 MED ORDER — LACTATED RINGERS IV SOLN
INTRAVENOUS | Status: DC
  Administered 2012-08-01: 13:00:00 via INTRAUTERINE

## 2012-08-01 MED ORDER — LIDOCAINE HCL (PF) 1 % IJ SOLN
INTRAMUSCULAR | Status: DC | PRN
  Administered 2012-08-01 (×4): 4 mL

## 2012-08-01 MED ORDER — LACTATED RINGERS IV SOLN
INTRAVENOUS | Status: DC
  Administered 2012-08-01: 11:00:00 via INTRAVENOUS

## 2012-08-01 NOTE — Progress Notes (Signed)
   Subjective: Pt received epidural and now more comfortable  Objective: BP 124/39  Pulse 102  Temp(Src) 98.7 F (37.1 C) (Oral)  Resp 18  Ht 5\' 6"  (1.676 m)  Wt 128.368 kg (283 lb)  BMI 45.7 kg/m2  SpO2 99%  LMP 04/15/2011      FHT:  FHR: 170 bpm, variability: moderate,  accelerations:  Present,  decelerations:  Present mild variable decels UC:   regular, every 3 minutes SVE:  90/5-6/-1 Labs: Lab Results  Component Value Date   WBC 19.3* 08/01/2012   HGB 11.6* 08/01/2012   HCT 35.4* 08/01/2012   MCV 88.5 08/01/2012   PLT 282 08/01/2012    Assessment / Plan: PIH labs WNL, BP improved with epidural IUPC and FSE placed and will augment as needed Nikolis Berent W 08/01/2012, 11:42 AM

## 2012-08-01 NOTE — Anesthesia Preprocedure Evaluation (Signed)
Anesthesia Evaluation  Patient identified by MRN, date of birth, ID band Patient awake    Reviewed: Allergy & Precautions, H&P , NPO status , Patient's Chart, lab work & pertinent test results, reviewed documented beta blocker date and time   History of Anesthesia Complications Negative for: history of anesthetic complications  Airway Mallampati: III TM Distance: >3 FB Neck ROM: full    Dental  (+) Teeth Intact   Pulmonary neg pulmonary ROS,  breath sounds clear to auscultation        Cardiovascular hypertension (PIH), Rhythm:regular Rate:Normal     Neuro/Psych  Headaches, negative psych ROS   GI/Hepatic negative GI ROS, (+)       marijuana use,   Endo/Other  Morbid obesity  Renal/GU negative Renal ROS     Musculoskeletal   Abdominal   Peds  Hematology negative hematology ROS (+)   Anesthesia Other Findings   Reproductive/Obstetrics (+) Pregnancy                           Anesthesia Physical Anesthesia Plan  ASA: III  Anesthesia Plan: Epidural   Post-op Pain Management:    Induction:   Airway Management Planned:   Additional Equipment:   Intra-op Plan:   Post-operative Plan:   Informed Consent: I have reviewed the patients History and Physical, chart, labs and discussed the procedure including the risks, benefits and alternatives for the proposed anesthesia with the patient or authorized representative who has indicated his/her understanding and acceptance.     Plan Discussed with:   Anesthesia Plan Comments:         Anesthesia Quick Evaluation

## 2012-08-01 NOTE — Progress Notes (Signed)
1335 dr Senaida Ores updated on fhr 185 baseline with intermittent late decels, pt position changed, oxygen continues, pt in full lateral positon, mvu's 170-210, 1000cc lr bolus going, urine concentrated and dark. Coming to evaluate pt. Continues to evalutate fhr remotely.

## 2012-08-01 NOTE — Anesthesia Procedure Notes (Signed)
Epidural Patient location during procedure: OB Start time: 08/01/2012 10:56 AM  Staffing Performed by: anesthesiologist   Preanesthetic Checklist Completed: patient identified, site marked, surgical consent, pre-op evaluation, timeout performed, IV checked, risks and benefits discussed and monitors and equipment checked  Epidural Patient position: sitting Prep: site prepped and draped and DuraPrep Patient monitoring: continuous pulse ox and blood pressure Approach: midline Injection technique: LOR air  Needle:  Needle type: Tuohy  Needle gauge: 17 G Needle length: 9 cm and 9 Needle insertion depth: 7 cm Catheter type: closed end flexible Catheter size: 19 Gauge Catheter at skin depth: 12 cm Test dose: negative  Assessment Events: blood not aspirated, injection not painful, no injection resistance, negative IV test and no paresthesia  Additional Notes Discussed risk of headache, infection, bleeding, nerve injury and failed or incomplete block.  Patient voices understanding and wishes to proceed.  Epidural placed easily on first attempt.  No paresthesia.  Patient tolerated procedure well with no apparent complications.  Jasmine December, MD Reason for block:procedure for pain

## 2012-08-01 NOTE — H&P (Signed)
Gabriela Daugherty is a 22 y.o. female G5P0040 at 39+ weeks (EDD 08/06/12 by 8 week [place] ) presenting for contractions and early labor.  Pregnancy complicated by poor obstetrical history with an incompetent cervix and loss of two pregnancies.  Twins at 19 weeks in 2012 and twins in 2013 at 21 weeks.  The patient had a prophylactic cerclage placed at 15 weeks this pregnancy and removed at 36+weeks.  She also has had elevated BP since about [redacted] weeks gestation.  PIH labs have remained normal and fetal testing has been reassuring.  SHe has not had significant proteinuria or symptoms.  She is also GBS positive.  Maternal Medical History:  Reason for admission: Contractions.   Contractions: Onset was 3-5 hours ago.   Frequency: regular.   Perceived severity is moderate.    Fetal activity: Perceived fetal activity is normal.    Prenatal complications: PIH.     OB History   Grav Para Term Preterm Abortions TAB SAB Ect Mult Living   5 1 0 1 3 2 1 0 2 0     EAB x 2  2010 2012 19 week twin loss with D&C for placenta 2013 21 week loss of twins, failed cerclage   Past Medical History  Diagnosis Date  . Headache   . Incompetent cervix   . Preterm labor    Past Surgical History  Procedure Laterality Date  . Dilation and evacuation  09/18/2011    Procedure: DILATATION AND EVACUATION;  Surgeon: Freddrick March. Tenny Craw, MD;  Location: [facility];  Service: Gynecology;  Laterality: N/A;  . Dilation and curettage of uterus    . Cerclage removal    . Cervical cerclage  02/12/2012    Procedure: CERCLAGE CERVICAL;  Surgeon: Lavina Hamman, MD;  Location: [facility];  Service: Gynecology;  Laterality: N/A;   Family History: family history includes Depression in her mother; Diabetes in her father; Hypertension in her father and mother; and Stroke in her father.  There is no history of Anesthesia problems. Social History:  reports that she has never smoked. She has never used smokeless tobacco. She reports that she uses illicit  drugs (Marijuana). She reports that she does not drink alcohol.   Prenatal Transfer Tool  Maternal Diabetes: No Genetic Screening: Normal Maternal Ultrasounds/Referrals: Normal Fetal Ultrasounds or other Referrals:  None Maternal Substance Abuse:  Yes:  Type: Other:  Significant Maternal Medications:  None Significant Maternal Lab Results:  Lab values include: Group B Strep positive Other Comments:  Marijuana use until about [redacted] weeks gestation  Review of Systems  Neurological: Negative for headaches.      Last menstrual period 04/15/2011. Maternal Exam:  Uterine Assessment: Contraction strength is moderate.  Contraction frequency is regular.   Abdomen: Patient reports no abdominal tenderness. Fetal presentation: vertex  Introitus: Normal vulva. Normal vagina.    Physical Exam  Constitutional: She is oriented to person, place, and time. She appears well-developed.  Cardiovascular: Normal rate and regular rhythm.   Respiratory: Effort normal and breath sounds normal.  GI: Soft. Bowel sounds are normal.  Genitourinary: Vagina normal.  Neurological: She is alert and oriented to person, place, and time.  Psychiatric: She has a normal mood and affect. Her behavior is normal.    Prenatal labs: ABO, Rh:  O+ Antibody:  negative Rubella:  immune RPR: NON REACTIVE (03/22 0135)  HBsAg:   NR HIV:   NR GBS: Positive (02/02 0000)  One hour GTT 117 Hgb AA First trimester screen and AFP  WNL  Assessment/Plan: Pt admitted with painful contractions, requesting epidural.  Will allow.  Will check PIH labs and UA.  PCN for +GBS.   Oliver Pila 08/01/2012, 10:03 AM

## 2012-08-01 NOTE — Progress Notes (Addendum)
1213 dr Senaida Ores called and notified of fhr baseline of 185, with vbl decels noted after uc's. Oxygen has continued, 2nd bolus given., pt in right tilt. Unable to assume full lateral because pt began to cry out in pain stating the epidural is not working in full lateral position. Notified IFSE fell off immediately after MD left. Last 2 temps reported. Orders received. Dr Senaida Ores plans to monitor fhr tracing from office.

## 2012-08-01 NOTE — Progress Notes (Signed)
  Subjective: Comfortable with epidural  Objective: BP 128/69  Pulse 94  Temp(Src) 98.2 F (36.8 C) (Oral)  Resp 18  Ht 5\' 6"  (1.676 m)  Wt 128.368 kg (283 lb)  BMI 45.7 kg/m2  SpO2 100%  LMP 04/15/2011   Total I/O In: -  Out: 100 [Urine:100]  FHT:  FHR: 175 bpm, variability: moderate,  accelerations:  Present,  decelerations:  Present mild/mod variable decels UC:   regular, every 1-3 minutes SVE:   Dilation: 8 Effacement (%): 90 Station: 0 Exam by:: dr Senaida Ores  Labs: Lab Results  Component Value Date   WBC 19.3* 08/01/2012   HGB 11.6* 08/01/2012   HCT 35.4* 08/01/2012   MCV 88.5 08/01/2012   PLT 282 08/01/2012    Assessment / Plan: Pt has fetal tachycardia of unknown etiology, no temp.  +GBS on ABX.  The FHR shows good variability but has had intermittent variable decels, some with a late component.  O2, position change, and amnioinfusion have had success at resolving decels intermittently.  Pt is making progress, but baby feels OP.  Will follow FHR closely.  Have d/w pt if FHR worsens, we would proceed with c-section. Gabriela Daugherty 08/01/2012, 1:58 PM

## 2012-08-01 NOTE — Progress Notes (Addendum)
1603 dr Senaida Ores notified of sve, temp 99.8/102, vbl decels with repetitiveness, pt took tylenol for headache. Orders received. Baseline of FHR unchanged 180. New orders received. Dr Senaida Ores continues to view FHR remotely.

## 2012-08-02 LAB — CBC
HCT: 26.7 % — ABNORMAL LOW (ref 36.0–46.0)
Hemoglobin: 9.1 g/dL — ABNORMAL LOW (ref 12.0–15.0)
MCH: 30 pg (ref 26.0–34.0)
MCHC: 34.1 g/dL (ref 30.0–36.0)
MCV: 88.1 fL (ref 78.0–100.0)
RDW: 14.2 % (ref 11.5–15.5)

## 2012-08-02 NOTE — Progress Notes (Signed)
Post Partum Day 1 Subjective: no complaints, up ad lib and tolerating PO  Objective: Blood pressure 139/72, pulse 89, temperature 97.5 F (36.4 C), temperature source Oral, resp. rate 18, height 5\' 6"  (1.676 m), weight 128.368 kg (283 lb), last menstrual period 04/15/2011, SpO2 100.00%, unknown if currently breastfeeding.  Physical Exam:  General: alert and cooperative Lochia: appropriate Uterine Fundus: firm    Recent Labs  08/01/12 1025 08/02/12 0515  HGB 11.6* 9.1*  HCT 35.4* 26.7*    Assessment/Plan: Plan for discharge tomorrow No temp since 6pm, will d/c abx after 2 more doses   LOS: 1 day   Gabriela Daugherty W 08/02/2012, 9:26 AM

## 2012-08-02 NOTE — Anesthesia Postprocedure Evaluation (Signed)
  Anesthesia Post-op Note  Patient: Gabriela Daugherty  Procedure(s) Performed: * No procedures listed *  Patient Location: Mother/Baby  Anesthesia Type:Epidural  Level of Consciousness: awake, alert  and oriented  Airway and Oxygen Therapy: Patient Spontanous Breathing  Post-op Pain: mild  Post-op Assessment: Post-op Vital signs reviewed, Patient's Cardiovascular Status Stable, No headache, No backache, No residual numbness and No residual motor weakness  Post-op Vital Signs: Reviewed and stable  Complications: No apparent anesthesia complications

## 2012-08-02 NOTE — Clinical Social Work Maternal (Signed)
    Clinical Social Work Department PSYCHOSOCIAL ASSESSMENT - MATERNAL/CHILD 08/02/2012  Patient:  NEA, GITTENS  Account Number:  [id number]  Admit Date:  08/01/2012  Marjo Bicker Name:   Leland Johns    Clinical Social Worker:  Nobie Putnam, LCSW   Date/Time:  08/02/2012 11:00 AM  Date Referred:  08/02/2012   Referral source  CN     Referred reason  Substance Abuse   Other referral source:    I:  FAMILY / HOME ENVIRONMENT Child's legal guardian:  PARENT  Guardian - Name Guardian - Age Guardian - Address  Shawnee Gambone [address] [place], [place] [address]  Merilyn Baba 25 (same as above)   Other household support members/support persons Other support:    II  PSYCHOSOCIAL DATA Information Source:    Event organiser Employment:   Financial resources:  [employer] If Medicaid - County:  [place] Other  WIC   School / Grade:   Maternity Care Coordinator / Child Services Coordination / Early Interventions:   [place]helly  Cultural issues impacting care:    III  STRENGTHS Strengths  Adequate Resources  Home prepared for Child (including basic supplies)  Supportive family/friends   Strength comment:    IV  RISK FACTORS AND CURRENT PROBLEMS Current Problem:  YES   Risk Factor & Current Problem Patient Issue Family Issue Risk Factor / Current Problem Comment  Substance Abuse Y N Hx of MJ    V  SOCIAL WORK ASSESSMENT Pt admits to smoking MJ "daily," prior to pregnancy confirmation at 4 weeks.  Once pregnancy was confirmed, she continued to smoke until she was 6 months.  She was able to refrain from smoking but relapsed on New Years.  She denies other illegal substance use.  CSW explained hospital drug testing policy & pt verbalized understanding.   UDS is negative, meconium results are pending.  Pt has all the necessary supplies for the infant and appears to be bonding well.  CSW will monitor drug screen results and make a referral if needed.       VI SOCIAL WORK PLAN Social Work Plan  No Further Intervention Required / No Barriers to Discharge   Type of pt/family education:   If child protective services report - county:   If child protective services report - date:   Information/referral to community resources comment:   Other social work plan:

## 2012-08-03 MED ORDER — IBUPROFEN 600 MG PO TABS: 600.0000 mg | ORAL_TABLET | Freq: Four times a day (QID) | ORAL | Status: DC

## 2012-08-03 MED ORDER — OXYCODONE-ACETAMINOPHEN 5-325 MG PO TABS: 1.0000 | ORAL_TABLET | ORAL | Status: DC | PRN

## 2012-08-03 NOTE — Progress Notes (Signed)
Post Partum Day 2 Subjective: no complaints, up ad lib and tolerating PO  Objective: Blood pressure 117/77, pulse 92, temperature 98.4 F (36.9 C), temperature source Oral, resp. rate 18, height 5\' 6"  (1.676 m), weight 128.368 kg (283 lb), last menstrual period 04/15/2011, SpO2 100.00%, unknown if currently breastfeeding.  Physical Exam:  General: alert and cooperative Lochia: appropriate Uterine Fundus: firm    Recent Labs  08/01/12 1025 08/02/12 0515  HGB 11.6* 9.1*  HCT 35.4* 26.7*    Assessment/Plan: Discharge home   LOS: 2 days   Cortez Flippen W 08/03/2012, 11:39 AM

## 2012-08-03 NOTE — Discharge Summary (Signed)
Obstetric Discharge Summary Reason for Admission: onset of labor Prenatal Procedures: PIH Intrapartum Procedures: spontaneous vaginal delivery Postpartum Procedures: antibiotics for chorioamnionitis Complications-Operative and Postpartum: second degree perineal laceration Hemoglobin  Date Value Range Status  08/02/2012 9.1* 12.0 - 15.0 g/dL Final     REPEATED TO VERIFY     DELTA CHECK NOTED     HCT  Date Value Range Status  08/02/2012 26.7* 36.0 - 46.0 % Final    Physical Exam:  General: alert and cooperative Lochia: appropriate Uterine Fundus: firm   Discharge Diagnoses: Term Pregnancy-delivered                                         Chorioamnionitis  Discharge Information: Date: 08/03/2012 Activity: pelvic rest Diet: routine Medications: Ibuprofen and Percocet Condition: improved Instructions: refer to practice specific booklet Discharge to: home Follow-up Information   Follow up with Oliver Pila, MD. Schedule an appointment as soon as possible for a visit in 6 weeks.   Contact information:   [address], [address] [place] [address] [phone]       Newborn Data: Live born female  Birth Weight: 7 lb 4.9 oz (3315 g) APGAR: 7, 8  Home with mother.  Oliver Pila 08/03/2012, 11:41 AM

## 2012-08-12 ENCOUNTER — Telehealth (HOSPITAL_COMMUNITY): Payer: Self-pay | Admitting: *Deleted

## 2012-08-12 NOTE — Telephone Encounter (Signed)
Resolve episode 

## 2012-12-01 ENCOUNTER — Inpatient Hospital Stay (HOSPITAL_COMMUNITY)
Admission: AD | Admit: 2012-12-01 | Discharge: 2012-12-02 | Disposition: A | Discharge status: Home / Self Care | Payer: Self-pay | Source: Ambulatory Visit | Attending: Obstetrics and Gynecology | Admitting: Obstetrics and Gynecology

## 2012-12-01 ENCOUNTER — Encounter (HOSPITAL_COMMUNITY): Payer: Self-pay | Admitting: *Deleted

## 2012-12-01 DIAGNOSIS — N920 Excessive and frequent menstruation with regular cycle: Secondary | ICD-10-CM | POA: Insufficient documentation

## 2012-12-01 DIAGNOSIS — B3731 Acute candidiasis of vulva and vagina: Secondary | ICD-10-CM | POA: Insufficient documentation

## 2012-12-01 DIAGNOSIS — N946 Dysmenorrhea, unspecified: Secondary | ICD-10-CM | POA: Insufficient documentation

## 2012-12-01 DIAGNOSIS — R109 Unspecified abdominal pain: Secondary | ICD-10-CM | POA: Insufficient documentation

## 2012-12-01 DIAGNOSIS — B373 Candidiasis of vulva and vagina: Secondary | ICD-10-CM

## 2012-12-01 LAB — URINALYSIS, ROUTINE W REFLEX MICROSCOPIC
Bilirubin Urine: NEGATIVE
Glucose, UA: NEGATIVE mg/dL
Ketones, ur: NEGATIVE mg/dL
Protein, ur: 100 mg/dL — AB

## 2012-12-01 LAB — URINE MICROSCOPIC-ADD ON

## 2012-12-01 LAB — POCT PREGNANCY, URINE: Preg Test, Ur: NEGATIVE

## 2012-12-01 NOTE — MAU Note (Signed)
C/o having a lot of cramping all day; c/o gush of blood with clots around 2110 tonight; c/o severe cramping now;

## 2012-12-01 NOTE — MAU Note (Signed)
Brought in by EMS; lobby is full of pt's friends and family;

## 2012-12-01 NOTE — MAU Provider Note (Signed)
Chief Complaint: Vaginal Bleeding  First Provider Initiated Contact with Patient 12/01/12 2356     SUBJECTIVE HPI: Gabriela Daugherty is a 22 y.o. [phone] nonpregnant female who presents to MAU by EMS for a moderate cramping and large gush of blood and blood clots immediately before calling EMS tonight. States cramping occurs prior to passing clots and resolves after the past. Denies fever, chills, vaginal discharge, dizziness, urinary complaints, GI complaints. Has not tried anything for the pain. Also reports vulvar itching.  Past Medical History  Diagnosis Date  . Headache(784.0)   . Incompetent cervix   . Preterm labor    OB History   Grav Para Term Preterm Abortions TAB SAB Ect Mult Living   5 2 1 1 3 2 1  0 2 1     # Outc Date GA Lbr Len/2nd Wgt Sex Del Anes PTL Lv   1A PRE 3/13  25:30 / 00:16 4.540JW(11.9JY) M SVD EPI  ND   Comments: 20wks 2 days   1B  3/13  25:30 / 00:15 7.829FA(21HY) M SVD EPI  ND   Comments: 20wks 2 days   2 TRM 2/14 [redacted]w[redacted]d 08:42 / 00:16  F SVD EPI  Yes   Comments: none   3 TAB            4 TAB            5 SAB  [redacted]w[redacted]d          Comments: twins, 19wks. D&C     Past Surgical History  Procedure Laterality Date  . Dilation and evacuation  09/18/2011    Procedure: DILATATION AND EVACUATION;  Surgeon: Freddrick March. Tenny Craw, MD;  Location: [facility];  Service: Gynecology;  Laterality: N/A;  . Dilation and curettage of uterus    . Cerclage removal    . Cervical cerclage  02/12/2012    Procedure: CERCLAGE CERVICAL;  Surgeon: Lavina Hamman, MD;  Location: [facility];  Service: Gynecology;  Laterality: N/A;   History   Social History  . Marital Status: Single    Spouse Name: N/A    Number of Children: N/A  . Years of Education: N/A   Occupational History  . Not on file.   Social History Main Topics  . Smoking status: Never Smoker   . Smokeless tobacco: Never Used  . Alcohol Use: No  . Drug Use: Yes    Special: Marijuana     Comment: Quit at 20weeks  . Sexually  Active: Not Currently   Other Topics Concern  . Not on file   Social History Narrative  . No narrative on file   No current facility-administered medications on file prior to encounter.   No current outpatient prescriptions on file prior to encounter.   No Known Allergies  ROS: Pertinent items in HPI  OBJECTIVE Blood pressure 126/81, pulse 66, temperature 97.5 F (36.4 C), temperature source Oral, resp. rate 18, height 5' 6.5" (1.689 m), weight 117.935 kg (260 lb), last menstrual period 11/04/2012, not currently breastfeeding. GENERAL: Well-developed, well-nourished female in mild distress.  HEENT: Normocephalic HEART: normal rate RESP: normal effort ABDOMEN: Soft, non-tender, obese. EXTREMITIES: Nontender, no edema NEURO: Alert and oriented SPECULUM EXAM: NEFG, small to moderate amount of bright red blood with fragments of white tissue and few tiny clots noted, cervix clean.  BIMANUAL: cervix closed; unable to assess uterine size due to body habitus, no adnexal tenderness or masses  LAB RESULTS Results for orders placed during the hospital encounter of 12/01/12 (from  the past 168 hour(s))  URINALYSIS, ROUTINE W REFLEX MICROSCOPIC   Collection Time    12/01/12 11:30 PM      Result Value Range   Color, Urine RED (*) YELLOW   APPearance CLOUDY (*) CLEAR   Specific Gravity, Urine 1.025  1.005 - 1.030   pH 6.0  5.0 - 8.0   Glucose, UA NEGATIVE  NEGATIVE mg/dL   Hgb urine dipstick LARGE (*) NEGATIVE   Bilirubin Urine NEGATIVE  NEGATIVE   Ketones, ur NEGATIVE  NEGATIVE mg/dL   Protein, ur 161 (*) NEGATIVE mg/dL   Urobilinogen, UA 1.0  0.0 - 1.0 mg/dL   Nitrite NEGATIVE  NEGATIVE   Leukocytes, UA MODERATE (*) NEGATIVE  URINE MICROSCOPIC-ADD ON   Collection Time    12/01/12 11:30 PM      Result Value Range   Squamous Epithelial / LPF RARE  RARE   WBC, UA 0-2  <3 WBC/hpf   RBC / HPF TOO NUMEROUS TO COUNT  <3 RBC/hpf   Urine-Other YEAST    POCT PREGNANCY, URINE    Collection Time    12/01/12 11:40 PM      Result Value Range   Preg Test, Ur NEGATIVE  NEGATIVE  WET PREP, GENITAL   Collection Time    12/01/12 11:55 PM      Result Value Range   Yeast Wet Prep HPF POC NONE SEEN  NONE SEEN   Trich, Wet Prep NONE SEEN  NONE SEEN   Clue Cells Wet Prep HPF POC NONE SEEN  NONE SEEN   WBC, Wet Prep HPF POC FEW (*) NONE SEEN  CBC   Collection Time    12/02/12 12:00 AM      Result Value Range   WBC 10.0  4.0 - 10.5 K/uL   RBC 4.14  3.87 - 5.11 MIL/uL   Hemoglobin 12.0  12.0 - 15.0 g/dL   HCT 09.6  04.5 - 40.9 %   MCV 87.0  78.0 - 100.0 fL   MCH 29.0  26.0 - 34.0 pg   MCHC 33.3  30.0 - 36.0 g/dL   RDW 81.1  91.4 - 78.2 %   Platelets 294  150 - 400 K/uL     IMAGING No results found.  MAU COURSE Pain resolved spontaneously in maternity admissions. Bleeding stable. First dose of Provera 10 mg given.  ASSESSMENT 1. Menorrhagia   2. Vulvovaginal candidiasis   3. Dysmenorrhea    PLAN Discharge home in stable condition. Bleeding precautions. Tissue removed from vagina sent to pathology. Follow-up Information   Follow up with Gynecologist. (As needed if symptoms continue)         Medication List    TAKE these medications       ibuprofen 200 MG tablet  Commonly known as:  ADVIL,MOTRIN  Take 400 mg by mouth every 6 (six) hours as needed for pain.     medroxyPROGESTERone 10 MG tablet  Commonly known as:  PROVERA  Take 1 tablet (10 mg total) by mouth daily.     nystatin-triamcinolone ointment  Commonly known as:  MYCOLOG  Apply topically 2 (two) times daily.       [place], CNM 12/01/2012  10:20 PM

## 2012-12-02 ENCOUNTER — Inpatient Hospital Stay (HOSPITAL_COMMUNITY): Payer: Self-pay

## 2012-12-02 DIAGNOSIS — N920 Excessive and frequent menstruation with regular cycle: Secondary | ICD-10-CM

## 2012-12-02 LAB — WET PREP, GENITAL
Clue Cells Wet Prep HPF POC: NONE SEEN
Yeast Wet Prep HPF POC: NONE SEEN

## 2012-12-02 LAB — CBC
HCT: 36 % (ref 36.0–46.0)
Hemoglobin: 12 g/dL (ref 12.0–15.0)
MCHC: 33.3 g/dL (ref 30.0–36.0)
MCV: 87 fL (ref 78.0–100.0)

## 2012-12-02 MED ORDER — NYSTATIN-TRIAMCINOLONE 100000-0.1 UNIT/GM-% EX OINT: TOPICAL_OINTMENT | Freq: Two times a day (BID) | CUTANEOUS | Status: DC

## 2012-12-02 MED ORDER — MEDROXYPROGESTERONE ACETATE 10 MG PO TABS: 10.0000 mg | ORAL_TABLET | Freq: Every day | ORAL | Status: DC

## 2012-12-08 NOTE — MAU Provider Note (Signed)
Attestation of Attending Supervision of Advanced Practitioner (CNM/NP): Evaluation and management procedures were performed by the Advanced Practitioner under my supervision and collaboration.  I have reviewed the Advanced Practitioner's note and chart, and I agree with the management and plan.  Aryan Bello 12/08/2012 10:06 AM

## 2013-03-09 IMAGING — US US OB COMP LESS 14 WK
1 series · 13 of 28 positions shown · non-contrast
Comparison: None.

CLINICAL DATA: Pregnant, vaginal bleeding

TWIN OBSTETRIC <14WK US
TECHNIQUE: Transabdominal ultrasound examinations was performed
for complete evaluation of the gestations as well as the maternal
uterus, adnexal regions, and pelvic cul-de-sac.

[Series 1: us ob comp less 14 wks · 13 of 39 slices shown]
[im 2/39]
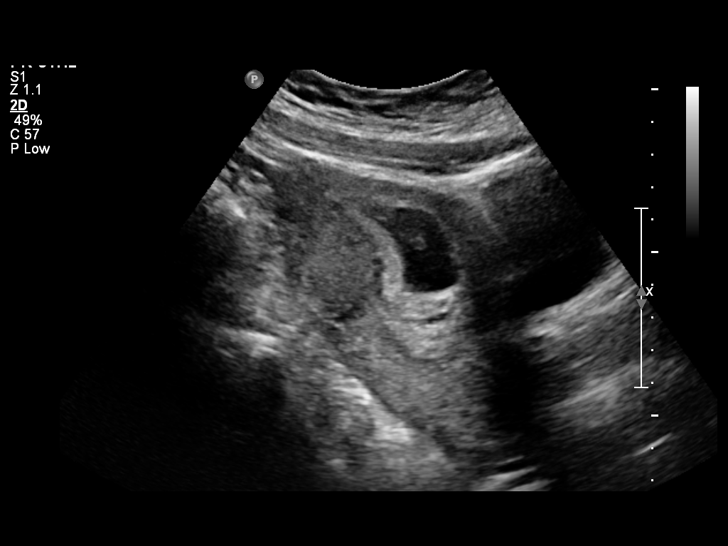
[im 5/39]
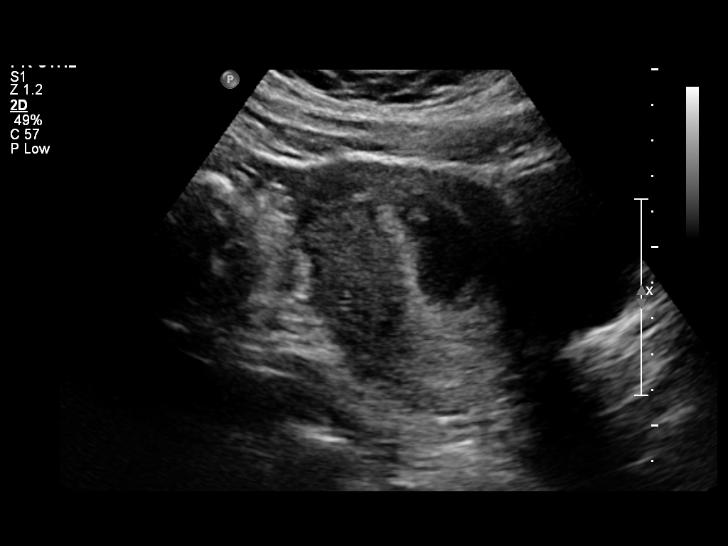
[im 8/39]
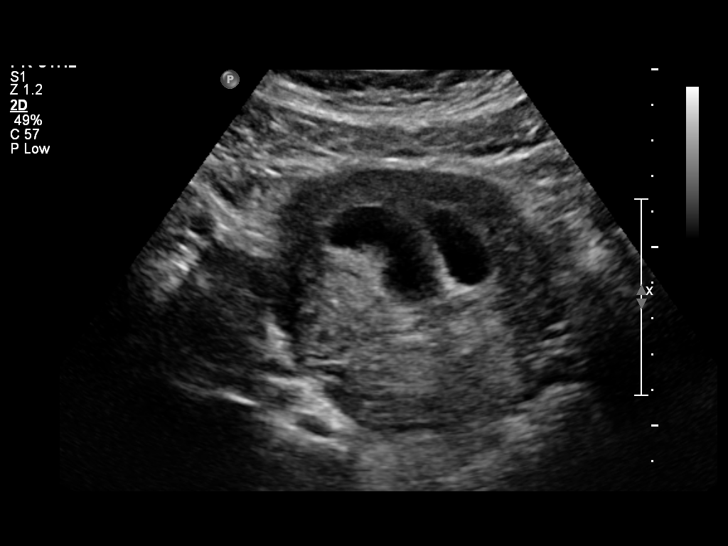
[im 10/39]
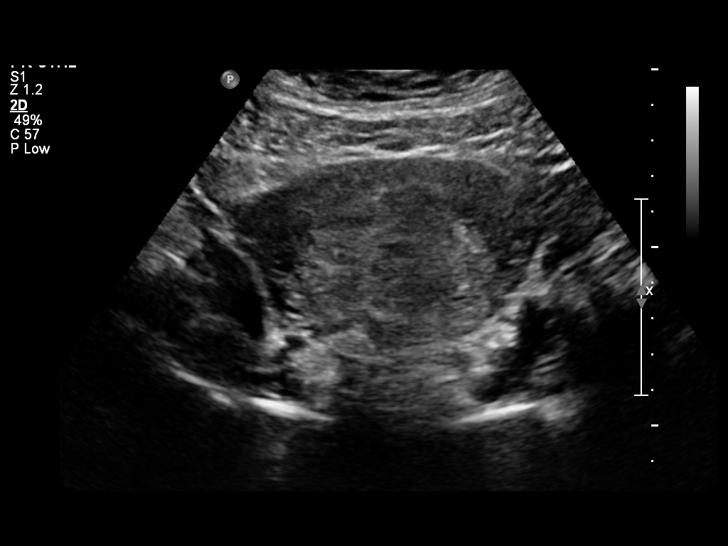
[im 13/39]
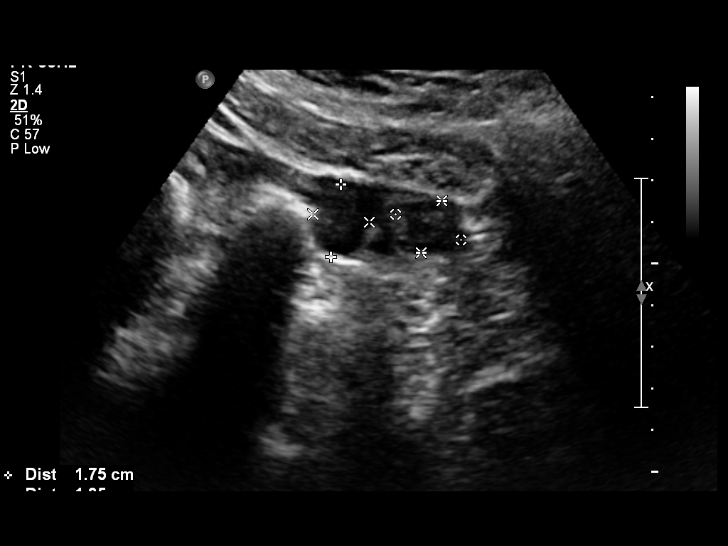
[im 16/39]
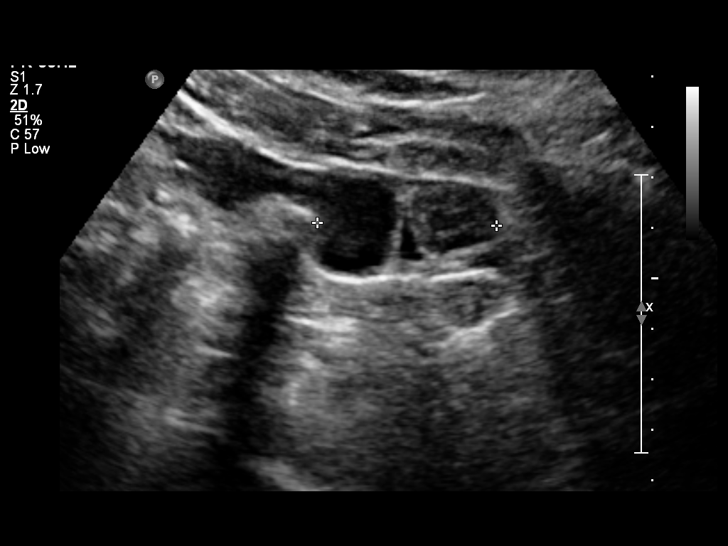
[im 20/39]
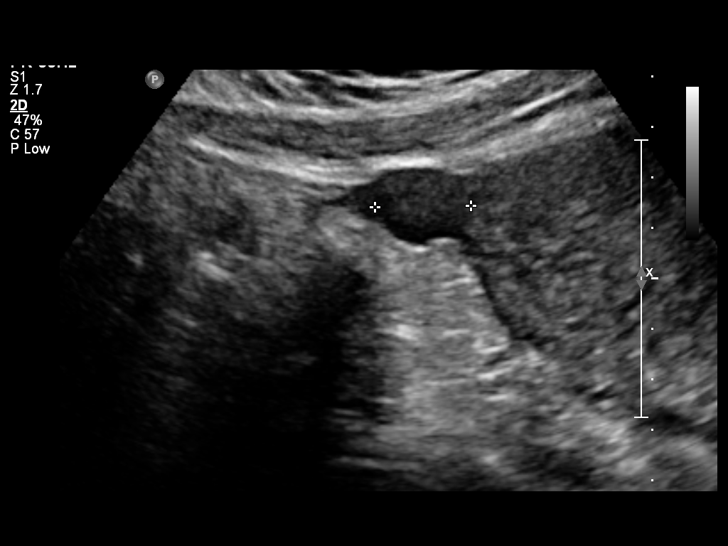
[im 23/39]
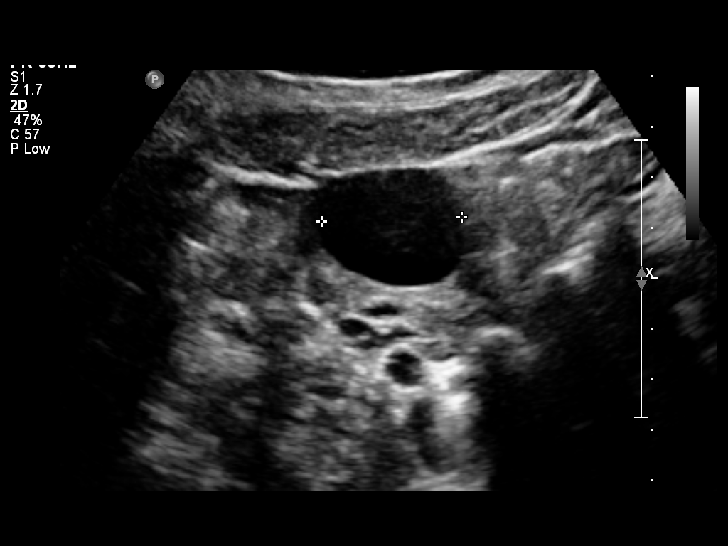
[im 26/39]
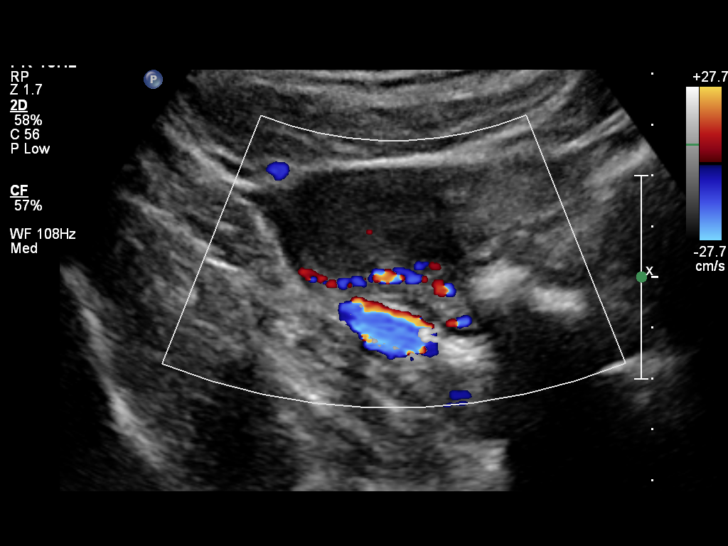
[im 29/39]
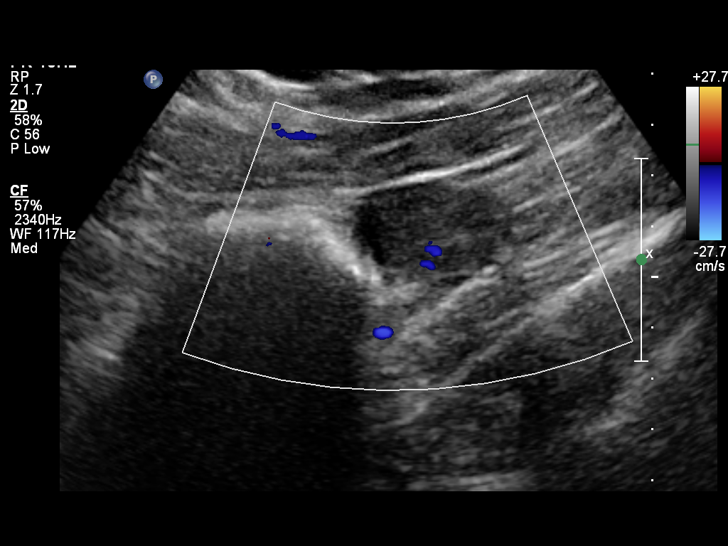
[im 31/39]
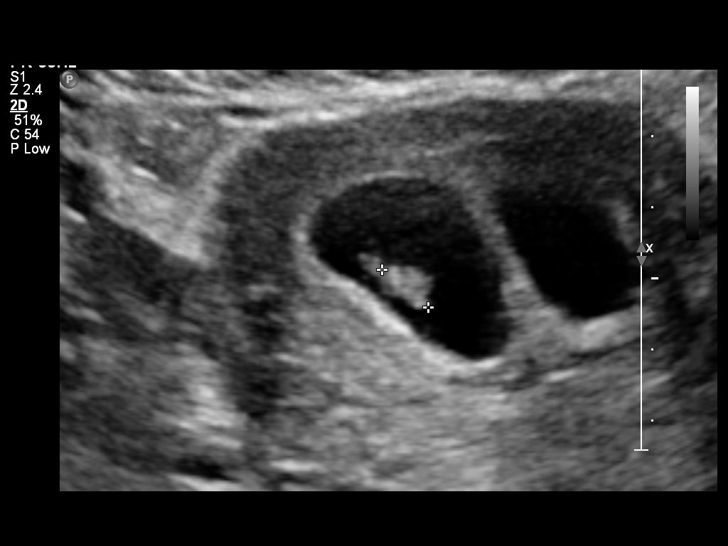
[im 34/39]
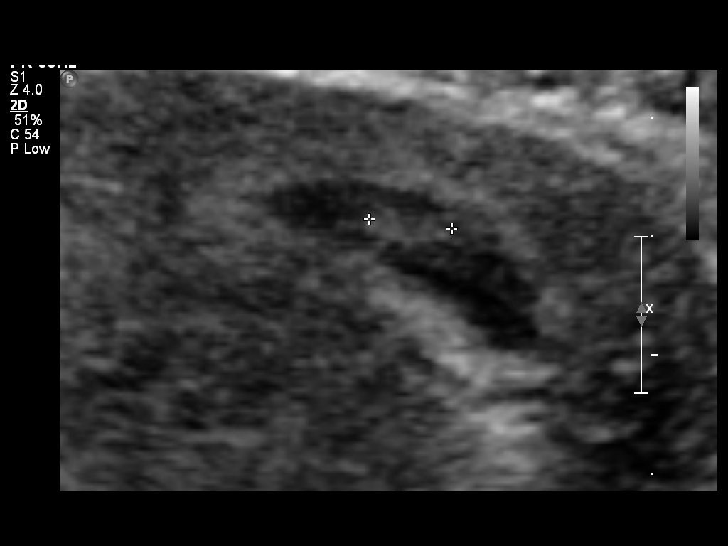
[im 37/39]
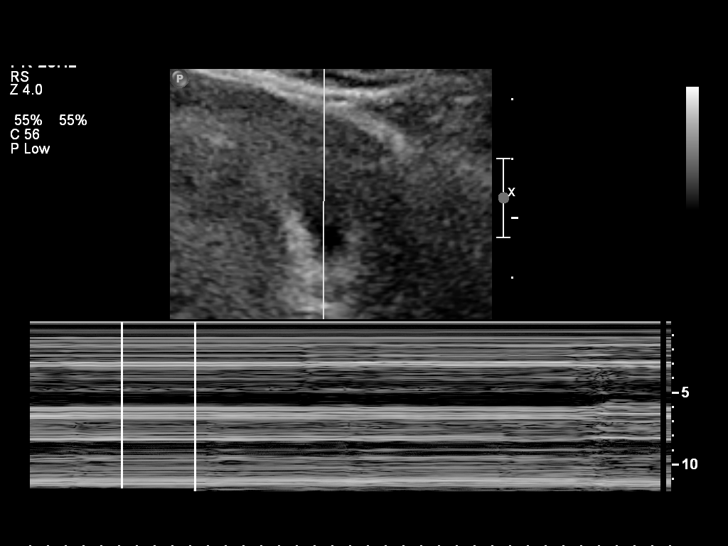

[13 of 28 positions shown; findings below may reference images not displayed]

TWIN A
Intrauterine gestational sac:  Visualized/normal in shape.
Yolk sac: Present
Embryo: Present
Cardiac Activity: Present
Heart Rate: 144 bpm

CRL: 8.3 mm, corresponding to an estimated gestational age of 6
weeks 6 days

TWIN B
Intrauterine gestational sac:  Visualized/normal in shape.
Yolk sac: Present
Embryo: Present
Cardiac Activity: Present
Heart Rate: 146 bpm

CRL: 6.4 mm, corresponding to an estimated gestational age of 6
weeks 4 days
US EDC: 01/23/2012

Maternal uterus/adnexae:
The gestations are dichorionic/diamniotic.  No subchorionic
hemorrhage.

Right ovary measures 4.6 x 2.8 x 3.6 cm and is notable for a 2.8 cm
cyst / follicle.

Left ovary measures 2.1 x 4.0 x 3.5 cm and is notable for two
mildly complex cysts/follicles measuring up to 1.8 cm.  2.2 cm left
paraovarian cyst.

No free fluid.
IMPRESSION: Dichorionic/diamniotic twin live intrauterine gestations with
estimated gestational age of 6 weeks 6 days by crown-rump length.

## 2013-04-20 ENCOUNTER — Other Ambulatory Visit: Payer: Self-pay

## 2013-05-24 IMAGING — US US OB TRANSVAGINAL
1 series · 14 of 28 positions shown · non-contrast
Comparison: none

[Series 1: us ob transvaginal · 0.22mm/px · 14 of 40 slices shown]
[im 2/40]
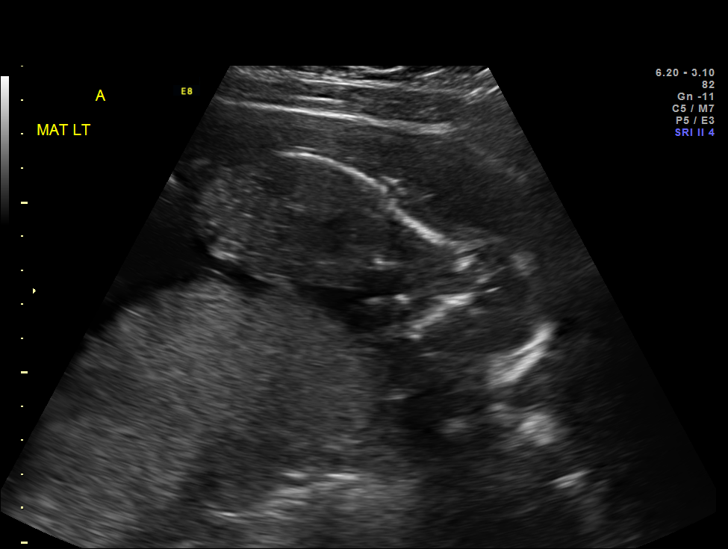
[im 5/40]
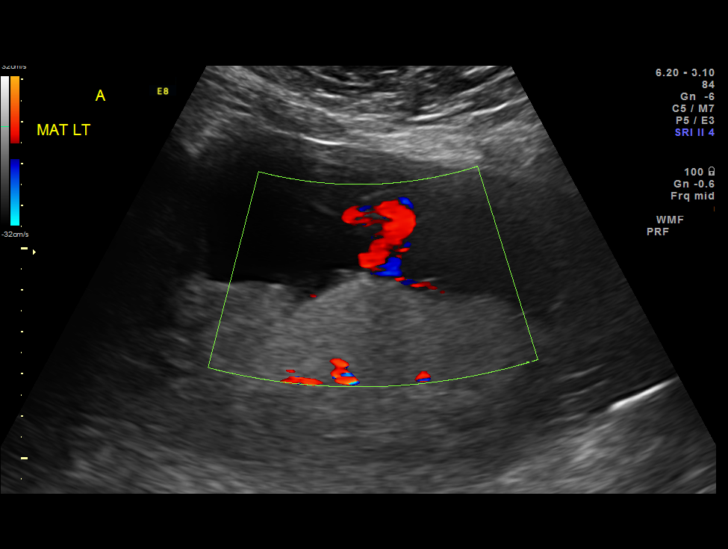
[im 8/40]
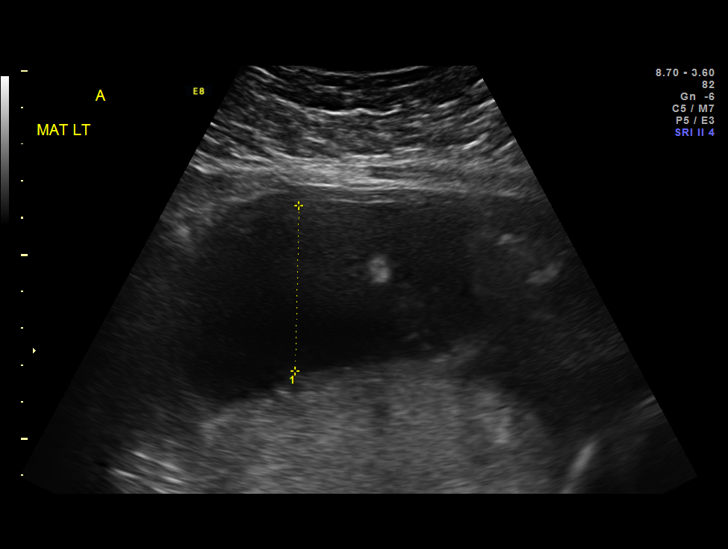
[im 11/40]
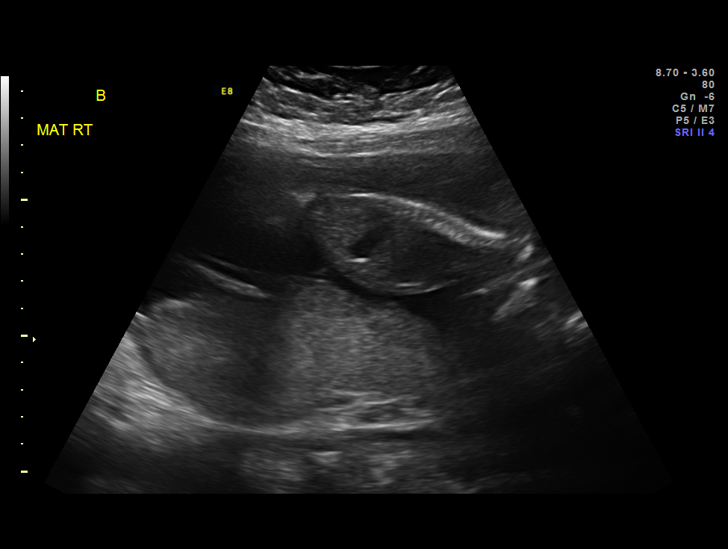
[im 14/40]
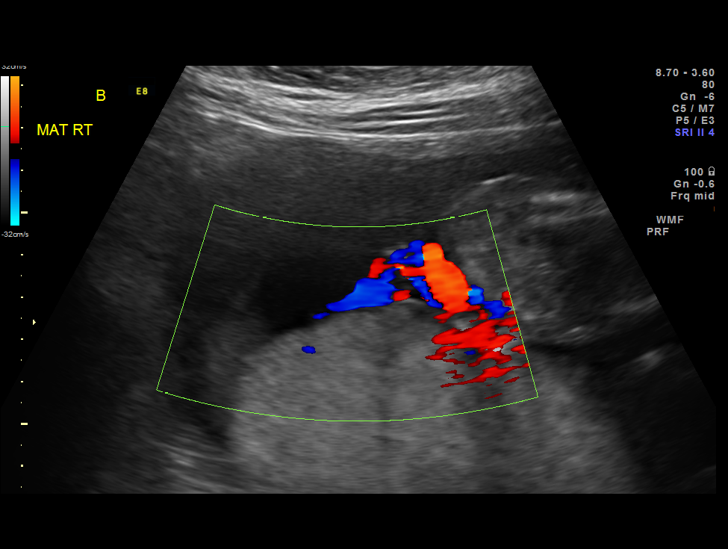
[im 16/40]
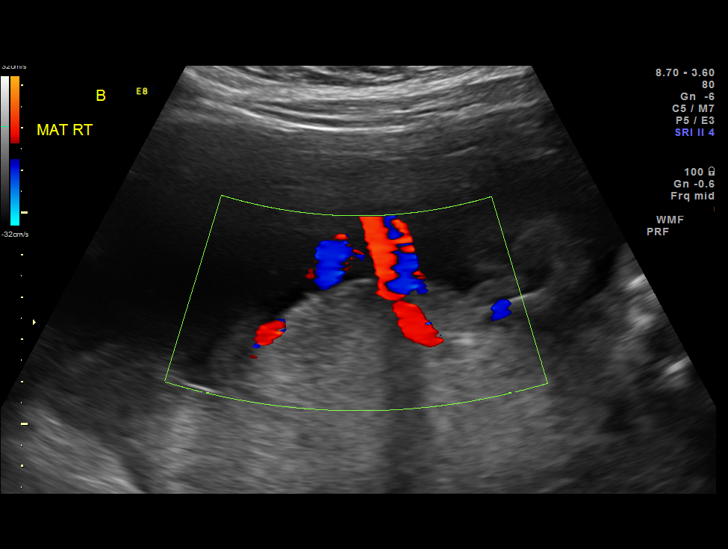
[im 19/40]
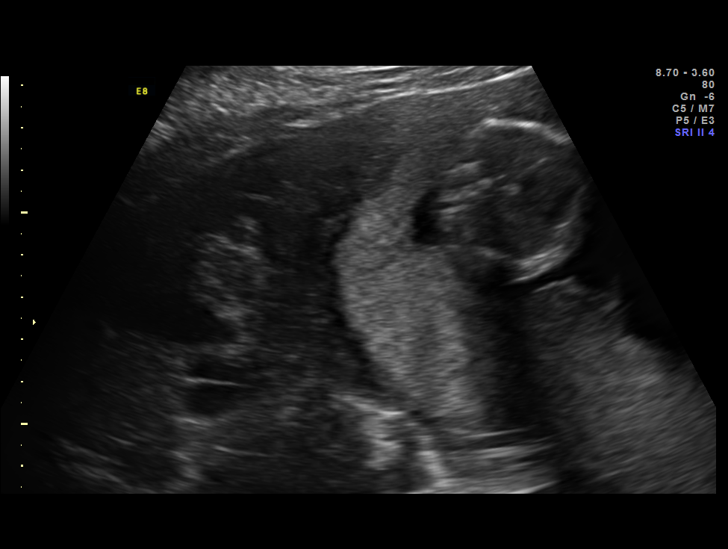
[im 22/40]
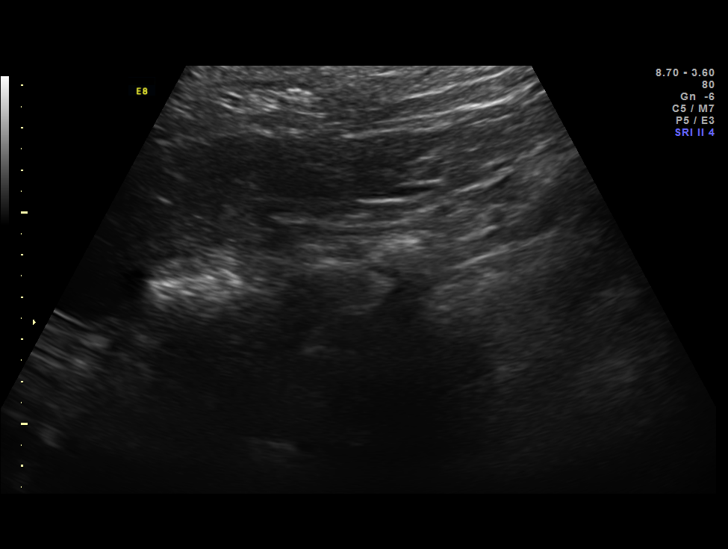
[im 25/40]
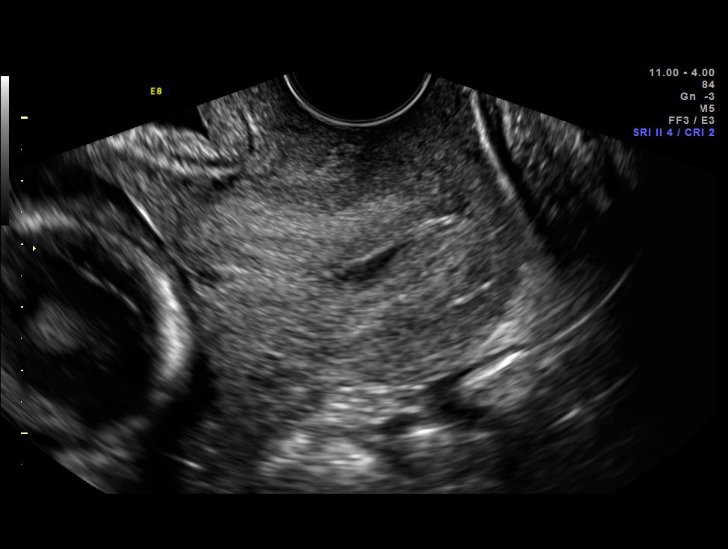
[im 28/40]
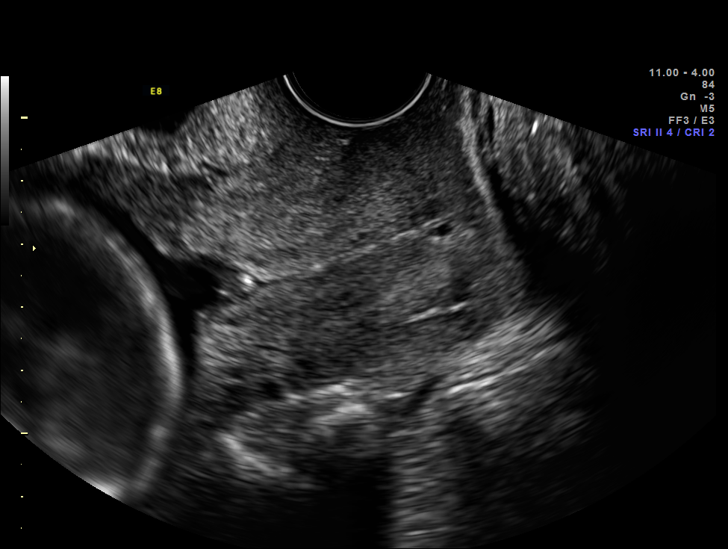
[im 31/40]
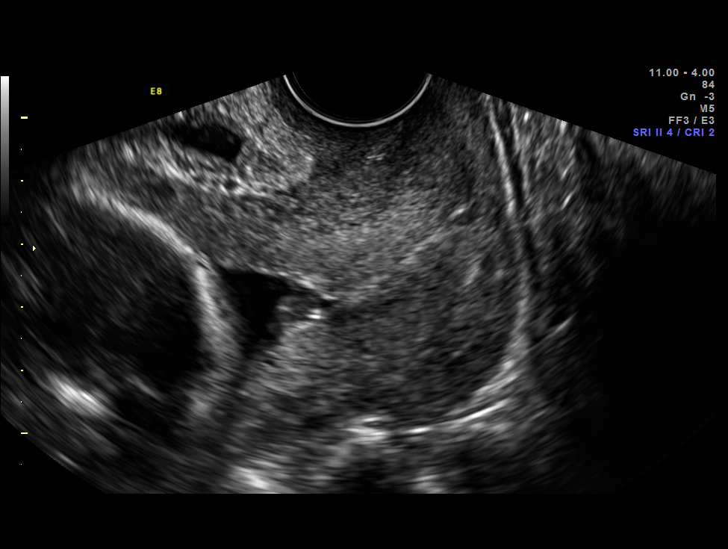
[im 34/40]
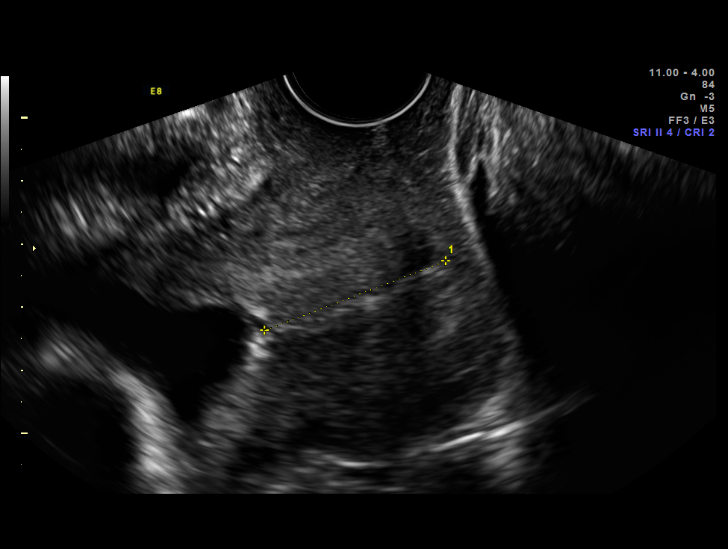
[im 37/40]
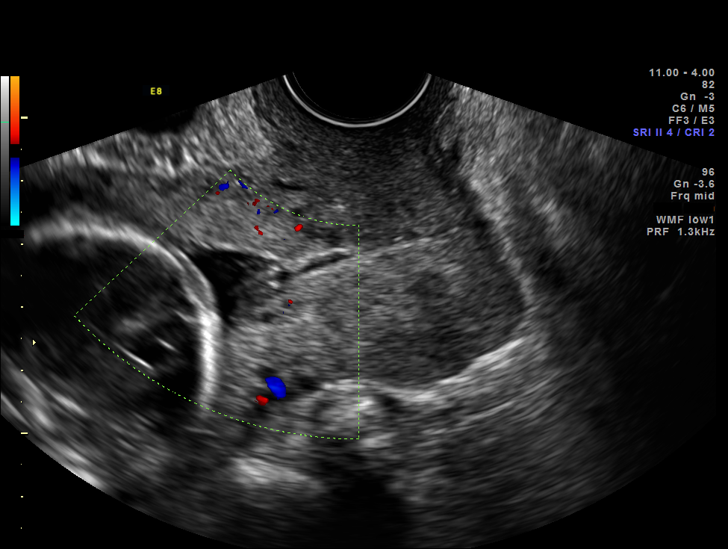
[im 40/40]
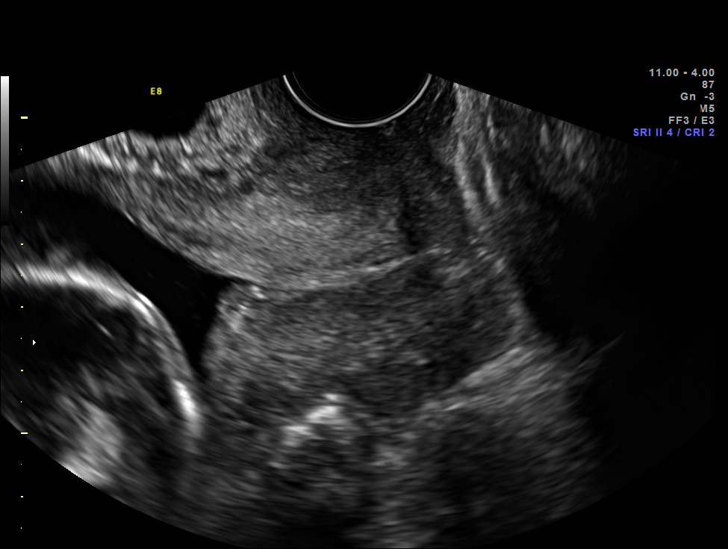

[14 of 28 positions shown; findings below may reference images not displayed]

Canned report from images found in remote index.

Refer to host system for actual result text.

## 2013-05-31 IMAGING — US US OB DETAIL EACH ADDL GEST + 14 WK
1 series · 14 of 28 positions shown · non-contrast
Comparison: none

[Series 1: us ob detail each addl gest + 14 wk · 0.27mm/px · 14 of 116 slices shown]
[im 5/116]
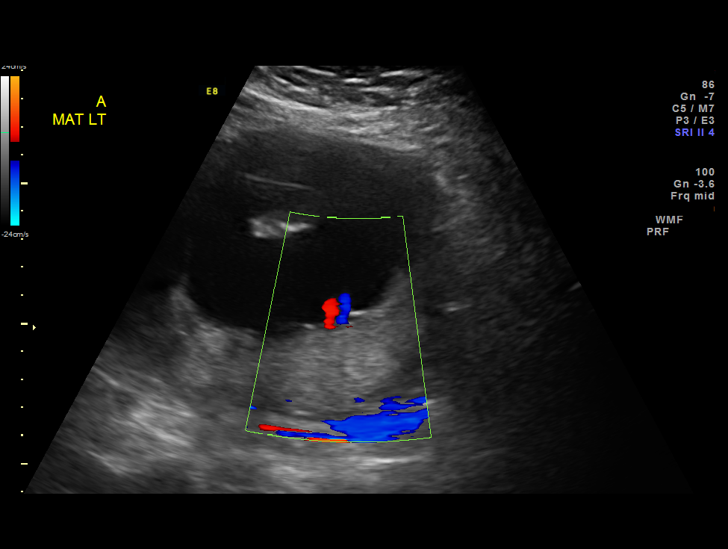
[im 13/116]
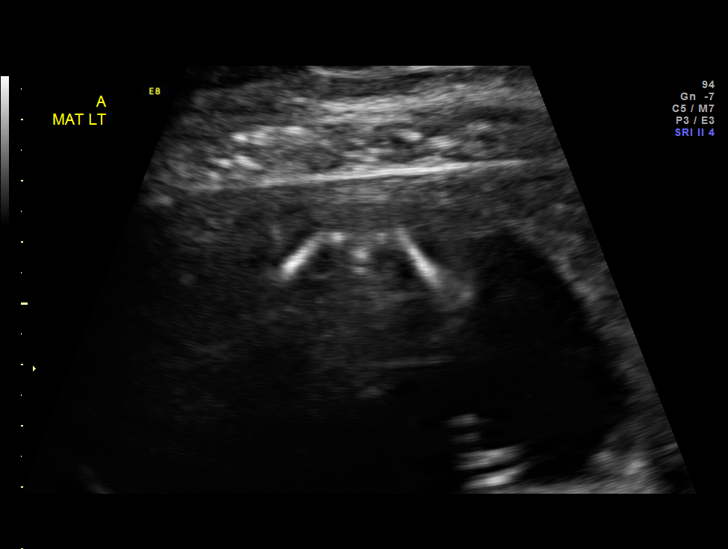
[im 22/116]
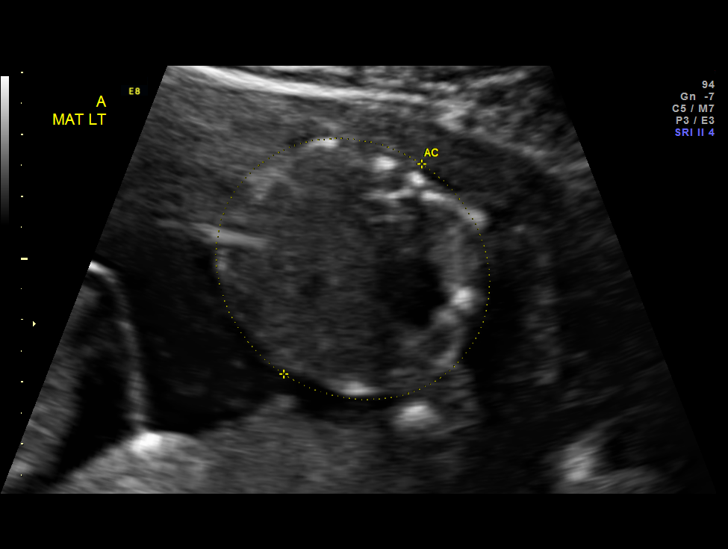
[im 30/116]
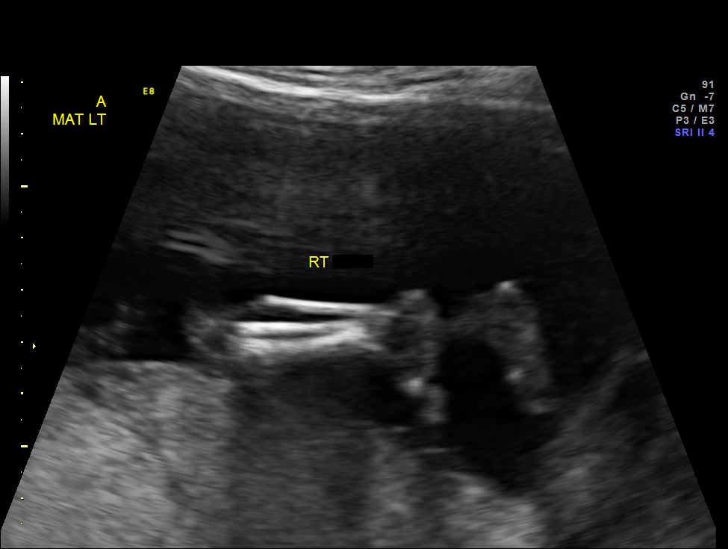
[im 39/116]
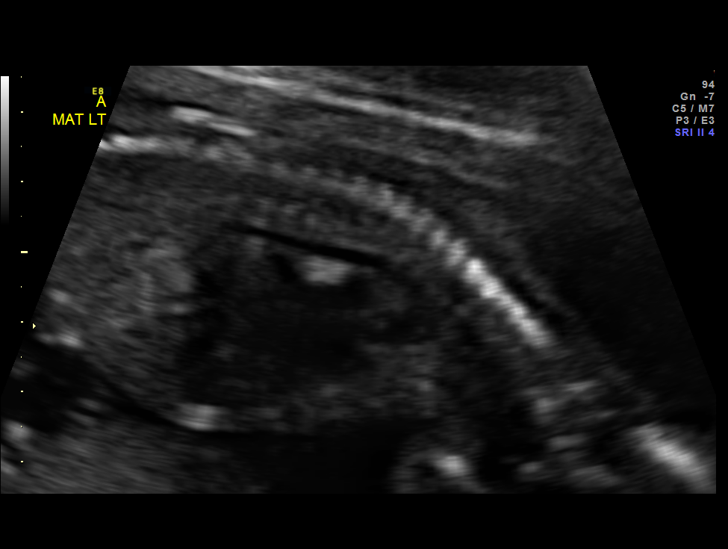
[im 47/116]
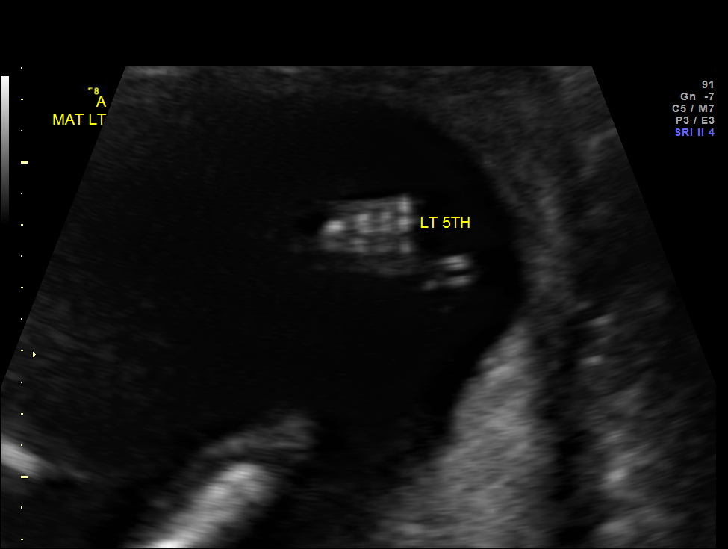
[im 56/116]
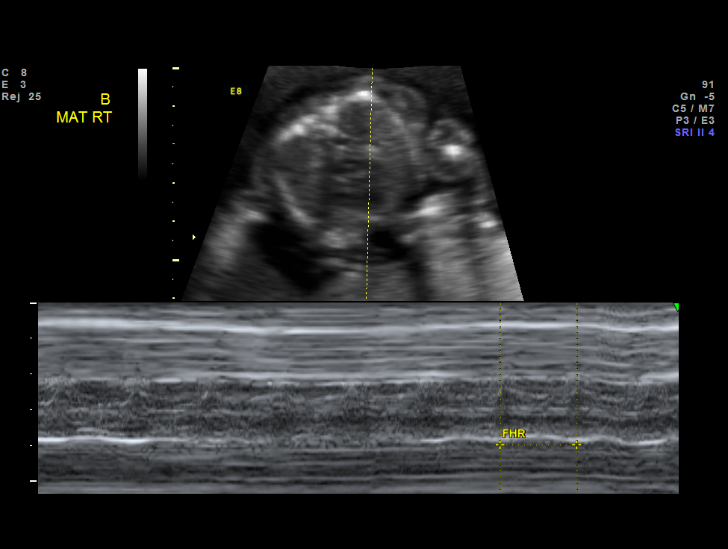
[im 64/116]
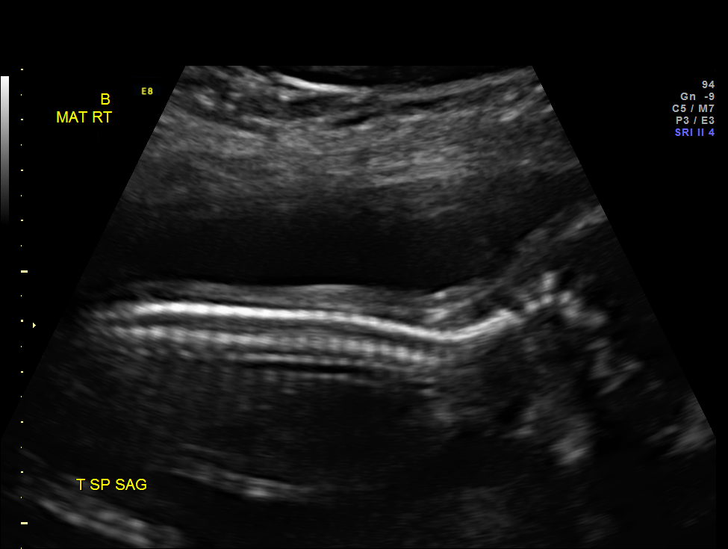
[im 73/116]
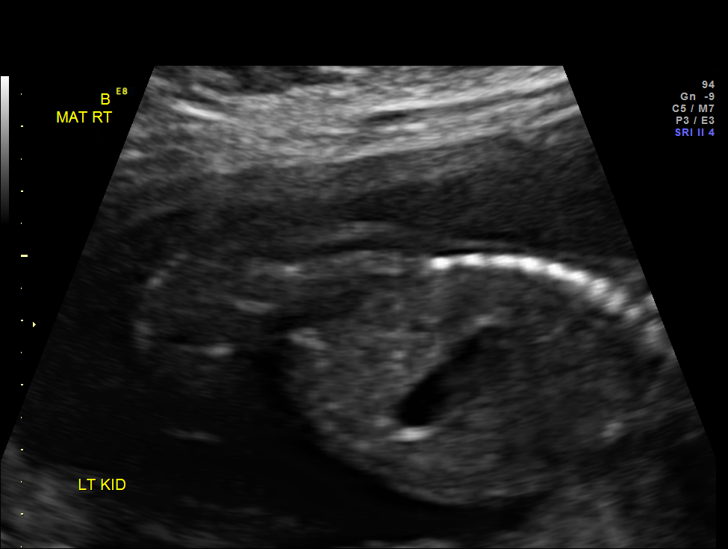
[im 81/116]
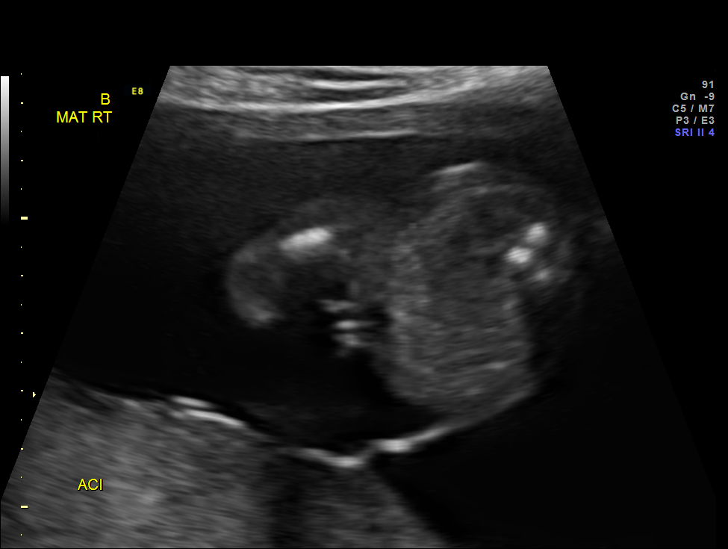
[im 90/116]
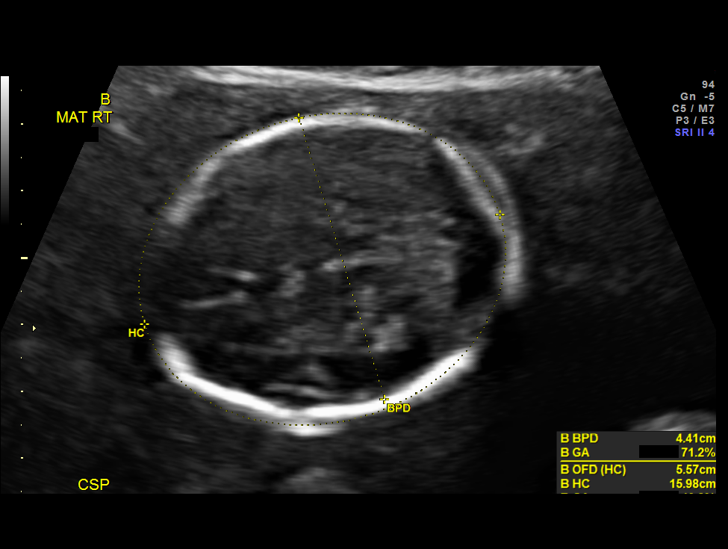
[im 98/116]
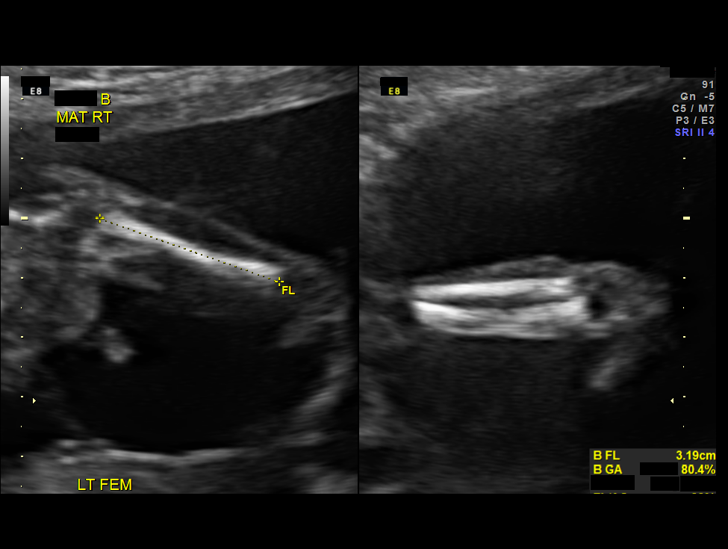
[im 107/116]
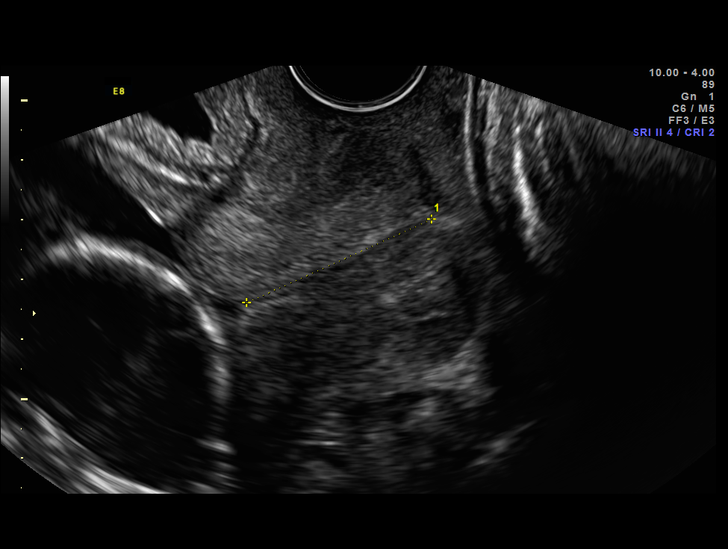
[im 116/116]
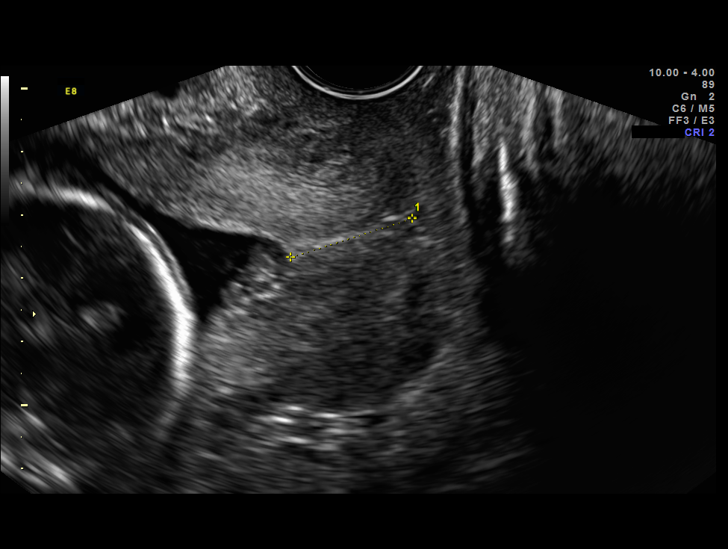

[14 of 28 positions shown; findings below may reference images not displayed]

Canned report from images found in remote index.

Refer to host system for actual result text.

## 2014-04-16 ENCOUNTER — Encounter (HOSPITAL_COMMUNITY): Payer: Self-pay | Admitting: *Deleted

## 2014-05-17 ENCOUNTER — Encounter (HOSPITAL_COMMUNITY): Payer: Self-pay | Admitting: Emergency Medicine

## 2014-05-17 ENCOUNTER — Emergency Department (HOSPITAL_COMMUNITY)
Admission: EM | Admit: 2014-05-17 | Discharge: 2014-05-17 | Disposition: A | Discharge status: Home / Self Care | Payer: Medicaid Other | Attending: Emergency Medicine | Admitting: Emergency Medicine

## 2014-05-17 DIAGNOSIS — J111 Influenza due to unidentified influenza virus with other respiratory manifestations: Secondary | ICD-10-CM

## 2014-05-17 DIAGNOSIS — Z8742 Personal history of other diseases of the female genital tract: Secondary | ICD-10-CM | POA: Insufficient documentation

## 2014-05-17 DIAGNOSIS — R69 Illness, unspecified: Secondary | ICD-10-CM

## 2014-05-17 DIAGNOSIS — Z8751 Personal history of pre-term labor: Secondary | ICD-10-CM | POA: Insufficient documentation

## 2014-05-17 DIAGNOSIS — G43009 Migraine without aura, not intractable, without status migrainosus: Secondary | ICD-10-CM | POA: Insufficient documentation

## 2014-05-17 MED ORDER — DIPHENHYDRAMINE HCL 50 MG/ML IJ SOLN
25.0000 mg | Freq: Once | INTRAMUSCULAR | Status: AC
  Administered 2014-05-17: 25 mg via INTRAVENOUS
  Filled 2014-05-17: qty 1

## 2014-05-17 MED ORDER — METOCLOPRAMIDE HCL 5 MG/ML IJ SOLN
10.0000 mg | Freq: Once | INTRAMUSCULAR | Status: AC
  Administered 2014-05-17: 10 mg via INTRAVENOUS
  Filled 2014-05-17: qty 2

## 2014-05-17 MED ORDER — KETOROLAC TROMETHAMINE 30 MG/ML IJ SOLN
30.0000 mg | Freq: Once | INTRAMUSCULAR | Status: AC
  Administered 2014-05-17: 30 mg via INTRAVENOUS
  Filled 2014-05-17: qty 1

## 2014-05-17 MED ORDER — BENZONATATE 100 MG PO CAPS
200.0000 mg | ORAL_CAPSULE | Freq: Once | ORAL | Status: AC
  Administered 2014-05-17: 200 mg via ORAL
  Filled 2014-05-17: qty 2

## 2014-05-17 MED ORDER — SODIUM CHLORIDE 0.9 % IV BOLUS (SEPSIS)
1000.0000 mL | Freq: Once | INTRAVENOUS | Status: AC
  Administered 2014-05-17: 1000 mL via INTRAVENOUS

## 2014-05-17 NOTE — ED Notes (Signed)
Pt c/o cough x 3 days and a headache. Pt states she does have migraines.

## 2014-05-17 NOTE — ED Provider Notes (Signed)
CSN: [id number]     Arrival date & time 05/17/14  [id number] History   First MD Initiated Contact with Patient 05/17/14 [phone]     Chief Complaint  Patient presents with  . Cough  . Headache     (Consider location/radiation/quality/duration/timing/severity/associated sxs/prior Treatment) Patient is a 23 y.o. female presenting with cough and headaches. The history is provided by the patient.  Cough Cough characteristics:  Productive Sputum characteristics:  Yellow Severity:  Moderate Duration:  3 days Timing:  Constant Progression:  Unchanged Chronicity:  New Context: sick contacts   Relieved by:  Nothing Worsened by:  Nothing tried Ineffective treatments:  None tried Associated symptoms: chills, diaphoresis, fever, headaches and myalgias   Associated symptoms: no chest pain, no ear fullness, no ear pain, no eye discharge, no rhinorrhea, no shortness of breath, no sinus congestion, no sore throat and no wheezing   Fever:    Duration:  3 days   Timing:  Intermittent   Temp source:  Subjective   Progression:  Waxing and waning Headache Pain location:  L temporal Quality:  Dull Radiates to:  Does not radiate Pain severity now: moderate. Pain scale at highest: severe yesterday. Onset quality:  Gradual Duration:  3 days Timing:  Intermittent Progression:  Waxing and waning Chronicity:  Recurrent Similar to prior headaches: yes   Relieved by: ibuprofen. Worsened by:  Activity (head movment, coughing) Associated symptoms: congestion, cough, fatigue, fever and myalgias   Associated symptoms: no abdominal pain, no back pain, no blurred vision, no diarrhea, no ear pain, no facial pain, no nausea, no near-syncope, no neck pain, no neck stiffness, no numbness, no paresthesias, no photophobia, no sore throat, no vomiting and no weakness     Past Medical History  Diagnosis Date  . Headache(784.0)   . Incompetent cervix   . Preterm labor    Past Surgical History  Procedure Laterality  Date  . Dilation and evacuation  09/18/2011    Procedure: DILATATION AND EVACUATION;  Surgeon: Freddrick MarchKendra H. Tenny Crawoss, MD;  Location: [facility];  Service: Gynecology;  Laterality: N/A;  . Dilation and curettage of uterus    . Cerclage removal    . Cervical cerclage  02/12/2012    Procedure: CERCLAGE CERVICAL;  Surgeon: Lavina Hammanodd Meisinger, MD;  Location: [facility];  Service: Gynecology;  Laterality: N/A;   Family History  Problem Relation Age of Onset  . Hypertension Mother   . Depression Mother   . Hypertension Father   . Diabetes Father   . Stroke Father   . Anesthesia problems Neg Hx    History  Substance Use Topics  . Smoking status: Never Smoker   . Smokeless tobacco: Never Used  . Alcohol Use: No   OB History    Gravida Para Term Preterm AB TAB SAB Ectopic Multiple Living   5 2 1 1 3 2 1  0 2 1     Review of Systems  Constitutional: Positive for fever, chills, diaphoresis and fatigue. Negative for activity change and appetite change.  HENT: Positive for congestion. Negative for ear pain, facial swelling, rhinorrhea and sore throat.   Eyes: Negative for blurred vision, photophobia and discharge.  Respiratory: Positive for cough. Negative for chest tightness, shortness of breath and wheezing.   Cardiovascular: Negative for chest pain, palpitations, leg swelling and near-syncope.  Gastrointestinal: Negative for nausea, vomiting, abdominal pain and diarrhea.  Endocrine: Negative for polydipsia and polyuria.  Genitourinary: Negative for dysuria, frequency, difficulty urinating and pelvic pain.  Musculoskeletal: Positive for  myalgias. Negative for back pain, arthralgias, neck pain and neck stiffness.  Skin: Negative for color change and wound.  Allergic/Immunologic: Negative for immunocompromised state.  Neurological: Positive for headaches. Negative for facial asymmetry, weakness, numbness and paresthesias.  Hematological: Does not bruise/bleed easily.  Psychiatric/Behavioral: Negative for  confusion and agitation.      Allergies  Review of patient's allergies indicates no known allergies.  Home Medications   Prior to Admission medications   Medication Sig Start Date End Date Taking? Authorizing Provider  ibuprofen (ADVIL,MOTRIN) 800 MG tablet Take 800-1,600 mg by mouth every 8 (eight) hours as needed for headache or moderate pain.   Yes Historical Provider, MD  medroxyPROGESTERone (PROVERA) 10 MG tablet Take 1 tablet (10 mg total) by mouth daily. Patient not taking: Reported on 05/17/2014 12/02/12   Dorathy KinsmanVirginia Smith, CNM  nystatin-triamcinolone ointment [facility](MYCOLOG) Apply topically 2 (two) times daily. Patient not taking: Reported on 05/17/2014 12/02/12   Dorathy KinsmanVirginia Smith, CNM   BP 125/80 mmHg  Pulse 69  Temp(Src) 98 F (36.7 C) (Oral)  Resp 18  SpO2 100%  LMP 05/10/2014 Physical Exam  Constitutional: She is oriented to person, place, and time. She appears well-developed and well-nourished. No distress.  HENT:  Head: Normocephalic and atraumatic.  Mouth/Throat: No oropharyngeal exudate.  Eyes: Pupils are equal, round, and reactive to light.  Neck: Normal range of motion. Neck supple.  Cardiovascular: Normal rate, regular rhythm and normal heart sounds.  Exam reveals no gallop and no friction rub.   No murmur heard. Pulmonary/Chest: Effort normal and breath sounds normal. No respiratory distress. She has no wheezes. She has no rales.  Abdominal: Soft. Bowel sounds are normal. She exhibits no distension and no mass. There is no tenderness. There is no rebound and no guarding.  Musculoskeletal: Normal range of motion. She exhibits no edema or tenderness.  Neurological: She is alert and oriented to person, place, and time.  Skin: Skin is warm and dry.  Psychiatric: She has a normal mood and affect.    ED Course  Procedures (including critical care time) Labs Review Labs Reviewed - No data to display  Imaging Review No results found.   EKG Interpretation None       MDM   Final diagnoses:  Influenza-like illness  Migraine without aura and without status migrainosus, not intractable    Pt is a 23 y.o. female with Pmhx as above who presents with about 4 days of fever, chills, productive cough, headache. H/a intermittent, gradual onset, and similar to prior migraines, partially releived by ibuprofen. Denies numbness, weakness, blurry vision, neck pain. On PE, VSS, pt in NAD. Cardiopulm exam benign. Suspect influinza-like illness triggering migraine. No neck pain, stiffness, doubt meningitis, also doubt SAH as pain not sudden onset, has been intermittent, and no neuro complaints.   Pt feeling improved after IVF w/ migraine cocktail. Will rec continued supportive care at home. Return precautions given for new or worsening symptoms including worsening pain, inability to tolerate PO.        Toy CookeyMegan Docherty, MD 05/17/14 1224

## 2014-05-17 NOTE — ED Notes (Signed)
Productive cough with yellow sputum, chills/fever, body aches, headache x 4 days.

## 2014-05-17 NOTE — Progress Notes (Signed)
  CARE MANAGEMENT ED NOTE 05/17/2014  Patient:  Gabriela Daugherty,Gabriela Daugherty   Account Number:  [id number]  Date Initiated:  05/17/2014  Documentation initiated by:  Edd ArbourGIBBS,Lolamae Voisin  Subjective/Objective Assessment:   23 yr old self pay pt (previously with medicaid that is not active at this time, never had an orange card) c/o Productive cough with yellow sputum, chills/fever, body aches, headache x 4 days.     Subjective/Objective Assessment Detail:   Pt confirms no pcp, no coverage  agreed to referral to [facility] for assistance     Action/Plan:   ED CM noted no pcp, no coverage for f/u pcp care See notes below   Action/Plan Detail:   Anticipated DC Date:  05/17/2014     Status Recommendation to Physician:   Result of Recommendation:    Other ED Services  Consult Working Plan    DC Planning Services  Other  Outpatient Services - Pt will follow up  PCP issues  [facility] / [facility] (established/new)    Choice offered to / List presented to:            Status of service:  Completed, signed off  ED Comments:   ED Comments Detail:  CM spoke with [place] who [place]. CM discussed and provided written information for self pay pcps, importance of pcp for f/u care, www.needymeds.org, discounted pharmacies and other [place] resources such as [facility], [facility], affordable care act,  financial assistance, DSS and  health department Reviewed resources for [place] self pay pcps like [facility], family medicine at [address], [facility], general medical clinics, [facility] plus others, medication resources, [facility] out [facility] pharmacies and housing Pt voiced understanding and appreciation of resources provided  Provided [facility] contact information Referral completed via email

## 2014-05-17 NOTE — Discharge Instructions (Signed)
Migraine Headache °A migraine headache is an intense, throbbing pain on one or both sides of your head. A migraine can last for 30 minutes to several hours. °CAUSES  °The exact cause of a migraine headache is not always known. However, a migraine may be caused when nerves in the brain become irritated and release chemicals that cause inflammation. This causes pain. °Certain things may also trigger migraines, such as: °· Alcohol. °· Smoking. °· Stress. °· Menstruation. °· Aged cheeses. °· Foods or drinks that contain nitrates, glutamate, aspartame, or tyramine. °· Lack of sleep. °· Chocolate. °· Caffeine. °· Hunger. °· Physical exertion. °· Fatigue. °· Medicines used to treat chest pain (nitroglycerine), birth control pills, estrogen, and some blood pressure medicines. °SIGNS AND SYMPTOMS °· Pain on one or both sides of your head. °· Pulsating or throbbing pain. °· Severe pain that prevents daily activities. °· Pain that is aggravated by any physical activity. °· Nausea, vomiting, or both. °· Dizziness. °· Pain with exposure to bright lights, loud noises, or activity. °· General sensitivity to bright lights, loud noises, or smells. °Before you get a migraine, you may get warning signs that a migraine is coming (aura). An aura may include: °· Seeing flashing lights. °· Seeing bright spots, halos, or zigzag lines. °· Having tunnel vision or blurred vision. °· Having feelings of numbness or tingling. °· Having trouble talking. °· Having muscle weakness. °DIAGNOSIS  °A migraine headache is often diagnosed based on: °· Symptoms. °· Physical exam. °· A CT scan or MRI of your head. These imaging tests cannot diagnose migraines, but they can help rule out other causes of headaches. °TREATMENT °Medicines may be given for pain and nausea. Medicines can also be given to help prevent recurrent migraines.  °HOME CARE INSTRUCTIONS °· Only take over-the-counter or prescription medicines for pain or discomfort as directed by your  health care provider. The use of long-term narcotics is not recommended. °· Lie down in a dark, quiet room when you have a migraine. °· Keep a journal to find out what may trigger your migraine headaches. For example, write down: °¨ What you eat and drink. °¨ How much sleep you get. °¨ Any change to your diet or medicines. °· Limit alcohol consumption. °· Quit smoking if you smoke. °· Get 7-9 hours of sleep, or as recommended by your health care provider. °· Limit stress. °· Keep lights dim if bright lights bother you and make your migraines worse. °SEEK IMMEDIATE MEDICAL CARE IF:  °· Your migraine becomes severe. °· You have a fever. °· You have a stiff neck. °· You have vision loss. °· You have muscular weakness or loss of muscle control. °· You start losing your balance or have trouble walking. °· You feel faint or pass out. °· You have severe symptoms that are different from your first symptoms. °MAKE SURE YOU:  °· Understand these instructions. °· Will watch your condition. °· Will get help right away if you are not doing well or get worse. °Document Released: 06/01/2005 Document Revised: 10/16/2013 Document Reviewed: 02/06/2013 °ExitCare® Patient Information ©2015 [employer]. This information is not intended to replace advice given to you by your health care provider. Make sure you discuss any questions you have with your health care provider. °Influenza °Influenza ("the flu") is a viral infection of the respiratory tract. It occurs more often in winter months because people spend more time in close contact with one another. Influenza can make you feel very sick. Influenza easily spreads   from person to person (contagious). °CAUSES  °Influenza is caused by a virus that infects the respiratory tract. You can catch the virus by breathing in droplets from an infected person's cough or sneeze. You can also catch the virus by touching something that was recently contaminated with the virus and then touching your  mouth, nose, or eyes. °RISKS AND COMPLICATIONS °You may be at risk for a more severe case of influenza if you smoke cigarettes, have diabetes, have chronic heart disease (such as heart failure) or lung disease (such as asthma), or if you have a weakened immune system. Elderly people and pregnant women are also at risk for more serious infections. The most common problem of influenza is a lung infection (pneumonia). Sometimes, this problem can require emergency medical care and may be life threatening. °SIGNS AND SYMPTOMS  °Symptoms typically last 4 to 10 days and may include: °· Fever. °· Chills. °· Headache, body aches, and muscle aches. °· Sore throat. °· Chest discomfort and cough. °· Poor appetite. °· Weakness or feeling tired. °· Dizziness. °· Nausea or vomiting. °DIAGNOSIS  °Diagnosis of influenza is often made based on your history and a physical exam. A nose or throat swab test can be done to confirm the diagnosis. °TREATMENT  °In mild cases, influenza goes away on its own. Treatment is directed at relieving symptoms. For more severe cases, your health care provider may prescribe antiviral medicines to shorten the sickness. Antibiotic medicines are not effective because the infection is caused by a virus, not by bacteria. °HOME CARE INSTRUCTIONS °· Take medicines only as directed by your health care provider. °· Use a cool mist humidifier to make breathing easier. °· Get plenty of rest until your temperature returns to normal. This usually takes 3 to 4 days. °· Drink enough fluid to keep your urine clear or pale yellow. °· Cover your mouth and nose when coughing or sneezing, and wash your hands well to prevent the virus from spreading. °· Stay home from work or school until the fever is gone for at least 1 full day. °PREVENTION  °An annual influenza vaccination (flu shot) is the best way to avoid getting influenza. An annual flu shot is now routinely recommended for all adults in the [place] °SEEK MEDICAL CARE  IF: °· You experience chest pain, your cough worsens, or you produce more mucus. °· You have nausea, vomiting, or diarrhea. °· Your fever returns or gets worse. °SEEK IMMEDIATE MEDICAL CARE IF: °· You have trouble breathing, you become short of breath, or your skin or nails become bluish. °· You have severe pain or stiffness in the neck. °· You develop a sudden headache, or pain in the face or ear. °· You have nausea or vomiting that you cannot control. °MAKE SURE YOU:  °· Understand these instructions. °· Will watch your condition. °· Will get help right away if you are not doing well or get worse. °Document Released: 05/29/2000 Document Revised: 10/16/2013 Document Reviewed: 08/31/2011 °ExitCare® Patient Information ©2015 [employer]. This information is not intended to replace advice given to you by your health care provider. Make sure you discuss any questions you have with your health care provider. ° °

## 2016-01-01 ENCOUNTER — Ambulatory Visit (HOSPITAL_COMMUNITY)
Admission: EM | Admit: 2016-01-01 | Discharge: 2016-01-01 | Disposition: A | Discharge status: Home / Self Care | Payer: 59 | Attending: Emergency Medicine | Admitting: Emergency Medicine

## 2016-01-01 ENCOUNTER — Encounter (HOSPITAL_COMMUNITY): Payer: Self-pay | Admitting: Emergency Medicine

## 2016-01-01 DIAGNOSIS — H1011 Acute atopic conjunctivitis, right eye: Secondary | ICD-10-CM

## 2016-01-01 MED ORDER — POLYMYXIN B-TRIMETHOPRIM 10000-0.1 UNIT/ML-% OP SOLN: 1.0000 [drp] | OPHTHALMIC | Status: AC

## 2016-01-01 NOTE — ED Notes (Signed)
Patient reports yesterday that right eye was swelled and matted shut.  Eye was red and blurry.  Patient sent home from work.  Today, patient reports her eye feels normal.  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  Denies any vision changes.  Patient does report her daughter is being treated for pink eye.   Patient is requesting a note so she can go to work

## 2016-01-01 NOTE — Discharge Instructions (Signed)
Your conjunctivitis does not appear to be due to bacteria. She had the redness, swelling and drainage get worse over the next few days feel you are antibiotic prescription and use it as directed. Otherwise use Zaditor eyedrops 1 drop twice a day for swelling and redness. Continue to use warm compresses frequently. Wash her hands frequently.  Allergic Conjunctivitis Allergic conjunctivitis is inflammation of the clear membrane that covers the white part of your eye and the inner surface of your eyelid (conjunctiva), and it is caused by allergies. The blood vessels in the conjunctiva become inflamed, and this causes the eye to become red or pink, and it often causes itchiness in the eye. Allergic conjunctivitis cannot be spread by one person to another person (noncontagious). CAUSES This condition is caused by an allergic reaction. Common causes of an allergic reaction (allergens) include: 1. Dust. 2. Pollen. 3. Mold. 4. Animal dander or secretions. RISK FACTORS This condition is more likely to develop if you are exposed to high levels of allergens that cause the allergic reaction. This might include being outdoors when air pollen levels are high or being around animals that you are allergic to. SYMPTOMS Symptoms of this condition may include: 1. Eye redness. 2. Tearing of the eyes. 3. Watery eyes. 4. Itchy eyes. 5. Burning feeling in the eyes. 6. Clear drainage from the eyes. 7. Swollen eyelids. DIAGNOSIS This condition may be diagnosed by medical history and physical exam. If you have drainage from your eyes, it may be tested to rule out other causes of conjunctivitis. TREATMENT Treatment for this condition often includes medicines. These may be eye drops, ointments, or oral medicines. They may be prescription medicines or over-the-counter medicines. HOME CARE INSTRUCTIONS  Take or apply medicines only as directed by your health care provider.  Do not touch or rub your eyes.  Do not  wear contact lenses until the inflammation is gone. Wear glasses instead.  Do not wear eye makeup until the inflammation is gone.  Apply a cool, clean washcloth to your eye for 10-20 minutes, 3-4 times a day.  Try to avoid whatever allergen is causing the allergic reaction. SEEK MEDICAL CARE IF:  Your symptoms get worse.  You have pus draining from your eye.  You have new symptoms.  You have a fever.   This information is not intended to replace advice given to you by your health care provider. Make sure you discuss any questions you have with your health care provider.   Document Released: 08/22/2002 Document Revised: 06/22/2014 Document Reviewed: 03/13/2014 Elsevier Interactive Patient Education 2016 [employer].  How to Use Eye Drops and Eye Ointments HOW TO APPLY EYE DROPS Follow these steps when applying eye drops: 5. Wash your hands. 6. Tilt your head back. 7. Put a finger under your eye and use it to gently pull your lower lid downward. Keep that finger in place. 8. Using your other hand, hold the dropper between your thumb and index finger. 9. Position the dropper just over the edge of the lower lid. Hold it as close to your eye as you can without touching the dropper to your eye. 10. Steady your hand. One way to do this is to lean your index finger against your brow. 11. Look up. 12. Slowly and gently squeeze one drop of medicine into your eye. 13. Close your eye. 14. Place a finger between your lower eyelid and your nose. Press gently for 2 minutes. This increases the amount of time that the medicine  is exposed to the eye. It also reduces side effects that can develop if the drop gets into the bloodstream through the nose. HOW TO APPLY EYE OINTMENTS Follow these steps when applying eye ointments: 8. Wash your hands. 9. Put a finger under your eye and use it to gently pull your lower lid downward. Keep that finger in place. 10. Using your other hand, place the tip of  the tube between your thumb and index finger with the remaining fingers braced against your cheek or nose. 11. Hold the tube just over the edge of your lower lid without touching the tube to your lid or eyeball. 12. Look up. 13. Line the inner part of your lower lid with ointment. 14. Gently pull up on your upper lid and look down. This will force the ointment to spread over the surface of the eye. 15. Release the upper lid. 16. If you can, close your eyes for 1-2 minutes. Do not rub your eyes. If you applied the ointment correctly, your vision will be blurry for a few minutes. This is normal. ADDITIONAL INFORMATION  Make sure to use the eye drops or ointment as told by your health care provider.  If you have been told to use both eye drops and an eye ointment, apply the eye drops first, then wait 3-4 minutes before you apply the ointment.  Try not to touch the tip of the dropper or tube to your eye. A dropper or tube that has touched the eye can become contaminated.   This information is not intended to replace advice given to you by your health care provider. Make sure you discuss any questions you have with your health care provider.   Document Released: 09/07/2000 Document Revised: 10/16/2014 Document Reviewed: 05/28/2014 Elsevier Interactive Patient Education [employer].

## 2016-01-01 NOTE — ED Provider Notes (Signed)
CSN: [id number]     Arrival date & time 01/01/16  1104 History   First MD Initiated Contact with Patient 01/01/16 1154     Chief Complaint  Patient presents with  . Eye Problem  . 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 She had tried some OTC eyedrops and today she use some warm compresses to what the drainage from her eyes but the swelling erythema and drainage as has much improved today. She was advised by her supervisor that she needed to be seen by medical personnel and have a note come back to work. Her vision is unchanged. She has no eye pain. There is currently little to no drainage there is minor watercolor tearing. Left eye appears to be unaffected and normal.   Past Medical History  Diagnosis Date  . Headache(784.0)   . Incompetent cervix   . Preterm labor    Past Surgical History  Procedure Laterality Date  . Dilation and evacuation  09/18/2011    Procedure: DILATATION AND EVACUATION;  Surgeon: Freddrick MarchKendra H. Tenny Crawoss, MD;  Location: [facility];  Service: Gynecology;  Laterality: N/A;  . Dilation and curettage of uterus    . Cerclage removal    . Cervical cerclage  02/12/2012    Procedure: CERCLAGE CERVICAL;  Surgeon: Lavina Hammanodd Meisinger, MD;  Location: [facility];  Service: Gynecology;  Laterality: N/A;   Family History  Problem Relation Age of Onset  . Hypertension Mother   . Depression Mother   . Hypertension Father   . Diabetes Father   . Stroke Father   . Anesthesia problems Neg Hx    Social History  Substance Use Topics  . Smoking status: Never Smoker   . Smokeless tobacco: Never Used  . Alcohol Use: No   OB History    Gravida Para Term Preterm AB TAB SAB Ectopic Multiple Living   5 2 1 1 3 2 1  0 2 1     Review of Systems  Constitutional:  Negative.   HENT: Negative.   Eyes: Positive for discharge and redness. Negative for pain, itching and visual disturbance.  Respiratory: Negative.   Skin: Negative.   All other systems reviewed and are negative.   Allergies  Review of patient's allergies indicates no known allergies.  Home Medications   Prior to Admission medications   Medication Sig Start Date End Date Taking? Authorizing Provider  ibuprofen (ADVIL,MOTRIN) 800 MG tablet Take 800-1,600 mg by mouth every 8 (eight) hours as needed for headache or moderate pain.    Historical Provider, MD  medroxyPROGESTERone (PROVERA) 10 MG tablet Take 1 tablet (10 mg total) by mouth daily. Patient not taking: Reported on 05/17/2014 12/02/12   Dorathy KinsmanVirginia Smith, CNM  nystatin-triamcinolone ointment [facility](MYCOLOG) Apply topically 2 (two) times daily. Patient not taking: Reported on 05/17/2014 12/02/12   Dorathy KinsmanVirginia Smith, CNM   Meds Ordered and Administered this Visit  Medications - No data to display  BP 124/70 mmHg  Pulse 78  Temp(Src) 98.7 F (37.1 C) (Oral)  Resp 16  SpO2 100% No data found.   Physical Exam  Constitutional: She is oriented to person, place, and time. She appears well-developed and well-nourished. No distress.  HENT:  Head: Normocephalic and atraumatic.  Eyes: EOM are normal. Pupils are equal, round, and reactive to light. Left eye exhibits no discharge. No scleral icterus.  Minimal swelling to the upper lid. Minor upper and lower lid conjunctival erythema. Sclera is clear but with minor injection. Palpebral fissure is wide open. No purulence or other drainage. Anterior chamber is clear.  Neck: Normal range of motion. Neck supple.  Cardiovascular: Normal rate.   Pulmonary/Chest: Effort normal.  Neurological: She is alert and oriented to person, place, and time.  Skin: Skin is warm and dry.  Psychiatric: She has a normal mood and affect.  Nursing note and vitals reviewed.   ED Course  Procedures (including critical  care time)  Labs Review Labs Reviewed - No data to display  Imaging Review No results found.   Visual Acuity Review  Right Eye Distance:   Left Eye Distance:   Bilateral Distance:    Right Eye Near:   Left Eye Near:    Bilateral Near:         MDM   1. Conjunctivitis, allergic, right    Your conjunctivitis does not appear to be due to bacteria. She had the redness, swelling and drainage get worse over the next few days feel you are antibiotic prescription and use it as directed. Otherwise use Zaditor eyedrops 1 drop twice a day for swelling and redness. Continue to use warm compresses frequently. Wash her hands frequently. Meds ordered this encounter  Medications  . trimethoprim-polymyxin b (POLYTRIM) ophthalmic solution    Sig: Place 1 drop into the right eye every 4 (four) hours.    Dispense:  10 mL    Refill:  0    Order Specific Question:  Supervising Provider    Answer:  Charm Rings [id number]   Fill if develops more swelling, increased redness, whitish or colored discharge.     Hayden Rasmussen, NP 01/01/16 1214

## 2017-03-01 ENCOUNTER — Encounter (HOSPITAL_COMMUNITY): Payer: Self-pay | Admitting: *Deleted

## 2017-03-01 ENCOUNTER — Emergency Department (HOSPITAL_COMMUNITY): Payer: 59

## 2017-03-01 ENCOUNTER — Emergency Department (HOSPITAL_COMMUNITY)
Admission: EM | Admit: 2017-03-01 | Discharge: 2017-03-01 | Disposition: A | Discharge status: Home / Self Care | Payer: 59 | Attending: Emergency Medicine | Admitting: Emergency Medicine

## 2017-03-01 DIAGNOSIS — M545 Low back pain, unspecified: Secondary | ICD-10-CM

## 2017-03-01 DIAGNOSIS — M5126 Other intervertebral disc displacement, lumbar region: Secondary | ICD-10-CM | POA: Insufficient documentation

## 2017-03-01 DIAGNOSIS — M5136 Other intervertebral disc degeneration, lumbar region: Secondary | ICD-10-CM

## 2017-03-01 LAB — URINALYSIS, ROUTINE W REFLEX MICROSCOPIC
BACTERIA UA: NONE SEEN
Bilirubin Urine: NEGATIVE
GLUCOSE, UA: NEGATIVE mg/dL
Hgb urine dipstick: NEGATIVE
Ketones, ur: NEGATIVE mg/dL
Leukocytes, UA: NEGATIVE
NITRITE: NEGATIVE
PROTEIN: 30 mg/dL — AB
Specific Gravity, Urine: 1.024 (ref 1.005–1.030)
pH: 7 (ref 5.0–8.0)

## 2017-03-01 LAB — POC URINE PREG, ED: Preg Test, Ur: NEGATIVE

## 2017-03-01 MED ORDER — DIAZEPAM 5 MG PO TABS
5.0000 mg | ORAL_TABLET | Freq: Once | ORAL | Status: AC
  Administered 2017-03-01: 5 mg via ORAL
  Filled 2017-03-01: qty 1

## 2017-03-01 MED ORDER — IBUPROFEN 600 MG PO TABS: 600.0000 mg | ORAL_TABLET | Freq: Four times a day (QID) | ORAL | 0 refills | Status: AC

## 2017-03-01 MED ORDER — HYDROMORPHONE HCL 1 MG/ML IJ SOLN
1.0000 mg | Freq: Once | INTRAMUSCULAR | Status: AC
  Administered 2017-03-01: 1 mg via INTRAMUSCULAR
  Filled 2017-03-01: qty 1

## 2017-03-01 MED ORDER — HYDROCODONE-ACETAMINOPHEN 5-325 MG PO TABS: 1.0000 | ORAL_TABLET | ORAL | 0 refills | Status: AC | PRN

## 2017-03-01 MED ORDER — ONDANSETRON HCL 4 MG PO TABS
4.0000 mg | ORAL_TABLET | Freq: Once | ORAL | Status: AC
  Administered 2017-03-01: 4 mg via ORAL
  Filled 2017-03-01: qty 1

## 2017-03-01 MED ORDER — CYCLOBENZAPRINE HCL 10 MG PO TABS: 10.0000 mg | ORAL_TABLET | Freq: Three times a day (TID) | ORAL | 0 refills | Status: AC

## 2017-03-01 NOTE — Discharge Instructions (Signed)
CT scan of your lower back shows some narrowing of the disc area at the upper portion of your lumbar spine. There are disc bulges at the L4-L5 area as well as the L5-S1 area. This is probably causing some of your pain. Please use a heating pad to your lower back. Please rest your back is much as possible. Use Flexeril and ibuprofen daily. Use the ibuprofen with food. Use Norco for more severe pain. Norco and Flexeril may cause drowsiness, please use these medications with caution. Please do not drive a vehicle, operate machinery, drink alcohol, sign legal documents, or participate in activities requiring concentration when taking these medications. Please see Dr. August Saucer, or member of his team concerning your back issue as soon as possible.

## 2017-03-01 NOTE — ED Notes (Signed)
Patient transported to CT 

## 2017-03-01 NOTE — ED Provider Notes (Signed)
[facility] Provider Note   CSN: [id number] Arrival date & time: 03/01/17  1604     History   Chief Complaint Chief Complaint  Patient presents with  . Back Pain    HPI Gabriela Daugherty is a 26 y.o. female.  Patient is a 26 year old female who presents to the emergency department by EMS because of lower back pain.  The patient states that she has been having some back problems for nearly 2 weeks. Today she woke up and had pain with any movement. She tried to get out of bed, but had severe pain and problem with that as well. She has not had any loss of bowel or bladder function. She has not had any loss of sensation in the saddle area. She's not had frequent falls recently. She's not had any previous operations or procedures involving her lower back. No injury or trauma reported. She presents now for assistance with this issue as she states the pain is nearly unbearable.   The history is provided by the patient.  Back Pain   Pertinent negatives include no chest pain, no fever, no abdominal pain and no dysuria.    Past Medical History:  Diagnosis Date  . Headache(784.0)   . Incompetent cervix   . Preterm labor     Patient Active Problem List   Diagnosis Date Noted  . NSVD (normal spontaneous vaginal delivery) 08/01/2012  . 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 Incompetent cervix in pregnancy, antepartum 02/12/2012    Past Surgical History:  Procedure Laterality Date  . CERCLAGE REMOVAL    . CERVICAL CERCLAGE  02/12/2012   Procedure: CERCLAGE CERVICAL;  Surgeon: Lavina Hamman, MD;  Location: [facility];  Service: Gynecology;  Laterality: N/A;  . DILATION AND CURETTAGE OF UTERUS    . DILATION AND EVACUATION  09/18/2011   Procedure: DILATATION AND EVACUATION;  Surgeon: Freddrick March. Tenny Craw, MD;  Location: [facility];  Service: Gynecology;  Laterality: N/A;    OB History    Gravida Para Term Preterm AB Living   SAB TAB Ectopic Multiple Live Births   1 2 0 2 3        Home Medications    Prior to Admission medications   Medication Sig Start Date End Date Taking? Authorizing Provider  ibuprofen (ADVIL,MOTRIN) 800 MG tablet Take 800-1,600 mg by mouth every 8 (eight) hours as needed for headache or moderate pain.    [provider]  trimethoprim-polymyxin b (POLYTRIM) ophthalmic solution Place 1 drop into the right eye every 4 (four) hours. 01/01/16   Hayden Rasmussen, NP    Family History Family History  Problem Relation Age of Onset  . Hypertension Mother   . Depression Mother   . Hypertension Father   . Diabetes Father   . Stroke Father   . Anesthesia problems Neg Hx     Social History Social History  Substance Use Topics  . Smoking status: Never Smoker  . Smokeless tobacco: Never Used  . Alcohol use No     Allergies   Patient has no known allergies.   Review of Systems Review of Systems  Constitutional: Negative for activity change and fever.       All ROS Neg except as noted in HPI  HENT: Negative for nosebleeds.   Eyes: Negative for photophobia and discharge.  Respiratory: Negative for cough, shortness of breath and wheezing.   Cardiovascular: Negative for chest pain and palpitations.  Gastrointestinal: Negative for abdominal  pain and blood in stool.  Genitourinary: Negative for dysuria, frequency and hematuria.  Musculoskeletal: Positive for back pain. Negative for arthralgias and neck pain.  Skin: Negative.   Neurological: Negative for dizziness, seizures and speech difficulty.  Psychiatric/Behavioral: Negative for confusion and hallucinations.     Physical Exam Updated Vital Signs BP 140/75   Pulse 70   Temp 97.6 F (36.4 C) (Oral)   Resp 18   Ht  (1.676 m)   Wt 115.2 kg (254 lb)   LMP 02/22/2017   SpO2 97%   BMI 41.00 kg/m   Physical Exam  Constitutional: She is oriented to person, place, and time. She appears well-developed and well-nourished.  Non-toxic appearance.  HENT:  Head:  Normocephalic.  Right Ear: Tympanic membrane and external ear normal.  Left Ear: Tympanic membrane and external ear normal.  Eyes: Pupils are equal, round, and reactive to light. EOM and lids are normal.  Neck: Normal range of motion. Neck supple. Carotid bruit is not present.  Cardiovascular: Normal rate, regular rhythm, normal heart sounds, intact distal pulses and normal pulses.   Pulmonary/Chest: Breath sounds normal. No respiratory distress.  Abdominal: Soft. Bowel sounds are normal. There is no tenderness. There is no guarding.  Musculoskeletal:       Lumbar back: She exhibits decreased range of motion, pain and spasm.       Back:  Lymphadenopathy:       Head (right side): No submandibular adenopathy present.       Head (left side): No submandibular adenopathy present.    She has no cervical adenopathy.  Neurological: She is alert and oriented to person, place, and time. She has normal strength. No cranial nerve deficit or sensory deficit.  Skin: Skin is warm and dry.  Psychiatric: She has a normal mood and affect. Her speech is normal.  Nursing note and vitals reviewed.    ED Treatments / Results  Labs (all labs ordered are listed, but only abnormal results are displayed) Labs Reviewed  POC URINE PREG, ED    EKG  EKG Interpretation None       Radiology No results found.  Procedures Procedures (including critical care time)  Medications Ordered in ED Medications  HYDROmorphone (DILAUDID) injection 1 mg (not administered)  ondansetron (ZOFRAN) tablet 4 mg (not administered)  diazepam (VALIUM) tablet 5 mg (not administered)     Initial Impression / Assessment and Plan / ED Course  I have reviewed the triage vital signs and the nursing notes.  Pertinent labs & imaging results that were available during my care of the patient were reviewed by me and considered in my medical decision making (see chart for details).       Final Clinical Impressions(s) / ED  Diagnoses MDM Vital signs within normal limits. No gross neurologic deficit appreciated. Patient has severe pain with even minimal movement of her back or her lower extremities.  Urine pregnancy test is negative. Urinalysis is negative for acute problem. CT scan of the lumbar spine shows some minimal disc narrowing at the L1-L2 area. There is some disc bulging at the L4-L5 area, as well as the L5-S1 area. No significant canal stenosis is appreciated.  I've instructed the patient on the findings on examination as well as the findings on the CT scan. The patient states that her pain is significantly improved after intramuscular pain medication.  No gross neurologic deficits appreciated. No evidence for caudal equina. The patient will be treated with elixir ill and ibuprofen  daily. Prescription for Norco also given for more severe pain. Patient referred to Dr. August Saucer for orthopedic evaluation concerning the back pain and the back issues noted on CT scan.    Final diagnoses:  Bulging of lumbar intervertebral disc  Acute low back pain without sciatica, unspecified back pain laterality    New Prescriptions New Prescriptions   No medications on file     Duayne Cal 03/01/17 Normand Sloop, MD 03/03/17 1556

## 2017-03-01 NOTE — ED Triage Notes (Signed)
Pt with lower back pain only with movement, ongoing for 2 weeks but worse with waking up today.

## 2017-04-06 ENCOUNTER — Ambulatory Visit (INDEPENDENT_AMBULATORY_CARE_PROVIDER_SITE_OTHER): Payer: Self-pay | Admitting: Orthopaedic Surgery
